# Patient Record
Sex: Female | Born: 1980 | Race: White | Hispanic: No | Marital: Single | Smoking: Never smoker
Health system: Southern US, Community
[De-identification: ages and names within clinical notes are randomized; demographics above are authoritative.]

## PROBLEM LIST (undated history)

## (undated) DIAGNOSIS — E282 Polycystic ovarian syndrome: Secondary | ICD-10-CM

## (undated) DIAGNOSIS — R51 Headache: Secondary | ICD-10-CM

## (undated) DIAGNOSIS — M67439 Ganglion, unspecified wrist: Secondary | ICD-10-CM

## (undated) DIAGNOSIS — R471 Dysarthria and anarthria: Secondary | ICD-10-CM

## (undated) DIAGNOSIS — E8881 Metabolic syndrome: Secondary | ICD-10-CM

## (undated) DIAGNOSIS — D6861 Antiphospholipid syndrome: Secondary | ICD-10-CM

## (undated) DIAGNOSIS — D689 Coagulation defect, unspecified: Secondary | ICD-10-CM

## (undated) DIAGNOSIS — N809 Endometriosis, unspecified: Secondary | ICD-10-CM

## (undated) DIAGNOSIS — R000 Tachycardia, unspecified: Secondary | ICD-10-CM

## (undated) DIAGNOSIS — M654 Radial styloid tenosynovitis [de Quervain]: Secondary | ICD-10-CM

## (undated) DIAGNOSIS — J45909 Unspecified asthma, uncomplicated: Secondary | ICD-10-CM

## (undated) DIAGNOSIS — B009 Herpesviral infection, unspecified: Secondary | ICD-10-CM

## (undated) DIAGNOSIS — E88819 Insulin resistance, unspecified: Secondary | ICD-10-CM

## (undated) DIAGNOSIS — E119 Type 2 diabetes mellitus without complications: Secondary | ICD-10-CM

## (undated) DIAGNOSIS — G4733 Obstructive sleep apnea (adult) (pediatric): Secondary | ICD-10-CM

## (undated) DIAGNOSIS — R011 Cardiac murmur, unspecified: Secondary | ICD-10-CM

## (undated) DIAGNOSIS — J302 Other seasonal allergic rhinitis: Secondary | ICD-10-CM

## (undated) DIAGNOSIS — G5601 Carpal tunnel syndrome, right upper limb: Secondary | ICD-10-CM

## (undated) DIAGNOSIS — R519 Headache, unspecified: Secondary | ICD-10-CM

## (undated) DIAGNOSIS — R102 Pelvic and perineal pain: Secondary | ICD-10-CM

## (undated) DIAGNOSIS — G43909 Migraine, unspecified, not intractable, without status migrainosus: Secondary | ICD-10-CM

## (undated) DIAGNOSIS — E559 Vitamin D deficiency, unspecified: Secondary | ICD-10-CM

## (undated) DIAGNOSIS — G8929 Other chronic pain: Secondary | ICD-10-CM

## (undated) DIAGNOSIS — J4530 Mild persistent asthma, uncomplicated: Secondary | ICD-10-CM

## (undated) HISTORY — DX: Pelvic and perineal pain: R10.2

## (undated) HISTORY — DX: Ganglion, unspecified wrist: M67.439

## (undated) HISTORY — DX: Radial styloid tenosynovitis (de quervain): M65.4

## (undated) HISTORY — DX: Other chronic pain: G89.29

## (undated) HISTORY — DX: Vitamin D deficiency, unspecified: E55.9

## (undated) HISTORY — PX: ABDOMINAL HYSTERECTOMY: SHX81

## (undated) HISTORY — DX: Mild persistent asthma, uncomplicated: J45.30

## (undated) HISTORY — PX: DRUG INDUCED ENDOSCOPY: SHX6808

## (undated) HISTORY — DX: Carpal tunnel syndrome, right upper limb: G56.01

## (undated) HISTORY — DX: Herpesviral infection, unspecified: B00.9

## (undated) HISTORY — DX: Dysarthria and anarthria: R47.1

## (undated) HISTORY — PX: OTHER SURGICAL HISTORY: SHX169

## (undated) HISTORY — DX: Polycystic ovarian syndrome: E28.2

## (undated) HISTORY — PX: TONSILLECTOMY: SUR1361

## (undated) HISTORY — DX: Migraine, unspecified, not intractable, without status migrainosus: G43.909

## (undated) HISTORY — DX: Other seasonal allergic rhinitis: J30.2

## (undated) HISTORY — PX: GALLBLADDER SURGERY: SHX652

## (undated) HISTORY — DX: Obstructive sleep apnea (adult) (pediatric): G47.33

---

## 2010-10-20 DIAGNOSIS — J4530 Mild persistent asthma, uncomplicated: Secondary | ICD-10-CM

## 2010-10-20 DIAGNOSIS — E282 Polycystic ovarian syndrome: Secondary | ICD-10-CM

## 2010-10-20 HISTORY — DX: Polycystic ovarian syndrome: E28.2

## 2010-10-20 HISTORY — DX: Mild persistent asthma, uncomplicated: J45.30

## 2010-11-04 DIAGNOSIS — E559 Vitamin D deficiency, unspecified: Secondary | ICD-10-CM

## 2010-11-04 DIAGNOSIS — J302 Other seasonal allergic rhinitis: Secondary | ICD-10-CM

## 2010-11-04 DIAGNOSIS — R7303 Prediabetes: Secondary | ICD-10-CM | POA: Insufficient documentation

## 2010-11-04 DIAGNOSIS — R030 Elevated blood-pressure reading, without diagnosis of hypertension: Secondary | ICD-10-CM | POA: Insufficient documentation

## 2010-11-04 DIAGNOSIS — E8881 Metabolic syndrome: Secondary | ICD-10-CM | POA: Insufficient documentation

## 2010-11-04 HISTORY — DX: Vitamin D deficiency, unspecified: E55.9

## 2010-11-04 HISTORY — DX: Other seasonal allergic rhinitis: J30.2

## 2010-11-05 DIAGNOSIS — D6861 Antiphospholipid syndrome: Secondary | ICD-10-CM | POA: Insufficient documentation

## 2011-09-23 DIAGNOSIS — G4733 Obstructive sleep apnea (adult) (pediatric): Secondary | ICD-10-CM

## 2011-09-23 HISTORY — DX: Obstructive sleep apnea (adult) (pediatric): G47.33

## 2011-10-07 DIAGNOSIS — G43909 Migraine, unspecified, not intractable, without status migrainosus: Secondary | ICD-10-CM

## 2011-10-07 DIAGNOSIS — Z8679 Personal history of other diseases of the circulatory system: Secondary | ICD-10-CM | POA: Insufficient documentation

## 2011-10-07 DIAGNOSIS — E669 Obesity, unspecified: Secondary | ICD-10-CM | POA: Insufficient documentation

## 2011-10-07 HISTORY — DX: Migraine, unspecified, not intractable, without status migrainosus: G43.909

## 2012-05-23 DIAGNOSIS — G8929 Other chronic pain: Secondary | ICD-10-CM | POA: Insufficient documentation

## 2012-05-23 DIAGNOSIS — R102 Pelvic and perineal pain unspecified side: Secondary | ICD-10-CM

## 2012-05-23 HISTORY — DX: Pelvic and perineal pain unspecified side: R10.20

## 2012-05-23 HISTORY — DX: Other chronic pain: G89.29

## 2013-04-26 DIAGNOSIS — M67439 Ganglion, unspecified wrist: Secondary | ICD-10-CM

## 2013-04-26 HISTORY — DX: Ganglion, unspecified wrist: M67.439

## 2013-05-10 DIAGNOSIS — M654 Radial styloid tenosynovitis [de Quervain]: Secondary | ICD-10-CM

## 2013-05-10 HISTORY — DX: Radial styloid tenosynovitis (de quervain): M65.4

## 2013-08-08 DIAGNOSIS — G5601 Carpal tunnel syndrome, right upper limb: Secondary | ICD-10-CM

## 2013-08-08 HISTORY — DX: Carpal tunnel syndrome, right upper limb: G56.01

## 2013-11-07 DIAGNOSIS — R1032 Left lower quadrant pain: Secondary | ICD-10-CM | POA: Insufficient documentation

## 2014-04-05 DIAGNOSIS — B009 Herpesviral infection, unspecified: Secondary | ICD-10-CM

## 2014-04-05 HISTORY — DX: Herpesviral infection, unspecified: B00.9

## 2014-05-19 ENCOUNTER — Ambulatory Visit: Payer: Self-pay | Admitting: Emergency Medicine

## 2014-09-28 ENCOUNTER — Ambulatory Visit
Admission: EM | Admit: 2014-09-28 | Discharge: 2014-09-28 | Disposition: A | Discharge status: Home / Self Care | Payer: Medicaid Other | Attending: Family Medicine | Admitting: Family Medicine

## 2014-09-28 ENCOUNTER — Encounter: Payer: Self-pay | Admitting: Emergency Medicine

## 2014-09-28 ENCOUNTER — Ambulatory Visit: Payer: Medicaid Other

## 2014-09-28 DIAGNOSIS — M79675 Pain in left toe(s): Secondary | ICD-10-CM | POA: Diagnosis present

## 2014-09-28 DIAGNOSIS — M779 Enthesopathy, unspecified: Secondary | ICD-10-CM

## 2014-09-28 HISTORY — DX: Cardiac murmur, unspecified: R01.1

## 2014-09-28 HISTORY — DX: Polycystic ovarian syndrome: E28.2

## 2014-09-28 HISTORY — DX: Headache, unspecified: R51.9

## 2014-09-28 HISTORY — DX: Coagulation defect, unspecified: D68.9

## 2014-09-28 HISTORY — DX: Metabolic syndrome: E88.81

## 2014-09-28 HISTORY — DX: Insulin resistance, unspecified: E88.819

## 2014-09-28 HISTORY — DX: Tachycardia, unspecified: R00.0

## 2014-09-28 HISTORY — DX: Unspecified asthma, uncomplicated: J45.909

## 2014-09-28 HISTORY — DX: Endometriosis, unspecified: N80.9

## 2014-09-28 HISTORY — DX: Headache: R51

## 2014-09-28 MED ORDER — NAPROXEN 500 MG PO TABS: 500.0000 mg | ORAL_TABLET | Freq: Two times a day (BID) | ORAL | Status: DC

## 2014-09-28 NOTE — ED Notes (Signed)
Left great toe pain since yesterday. Hurts to put weight on it. Has tried Ibuprofen and used Ice.

## 2014-09-28 NOTE — ED Provider Notes (Signed)
Patient presents today with symptoms of left big toe pain since yesterday. Patient denies any trauma or injury to the site. She denies any history of gout. She states that there is pain when she puts weight on it and bends her toe. She has tried ibuprofen and ice. She denies any other joint pain. She admits to some intake of red meat yesterday but this was after the pain started. She denies any recent alcohol use or seafood intake.  Review systems negative except mentioned above. Vitals as per Epic  General-NAD Respiratory-CTA bilateral Cardiac-regular rate rhythm MSK-no obvious deformity of left toe, mild tenderness of MTP joint, reproduced with resisted extension and flexion, neurovascularly intact, no erythema warmth of the area  Assessment and Plan: L 1st Toe Pain- discussed possible tendinitis, x-rays appear to be normal, would treat with naproxen when necessary, ice when necessary, hard soled shoe that limits movement of the toe, seek medical attention if symptoms persist or worsen.    Jolene ProvostKirtida Karanveer Ramakrishnan, MD 09/28/14 1149

## 2014-10-10 DIAGNOSIS — R471 Dysarthria and anarthria: Secondary | ICD-10-CM

## 2014-10-10 HISTORY — DX: Dysarthria and anarthria: R47.1

## 2016-11-15 ENCOUNTER — Emergency Department
Admission: EM | Admit: 2016-11-15 | Discharge: 2016-11-16 | Disposition: A | Discharge status: Home / Self Care | Payer: Self-pay | Attending: Emergency Medicine | Admitting: Emergency Medicine

## 2016-11-15 ENCOUNTER — Encounter: Payer: Self-pay | Admitting: Emergency Medicine

## 2016-11-15 DIAGNOSIS — J45909 Unspecified asthma, uncomplicated: Secondary | ICD-10-CM | POA: Insufficient documentation

## 2016-11-15 DIAGNOSIS — R1031 Right lower quadrant pain: Secondary | ICD-10-CM | POA: Insufficient documentation

## 2016-11-15 DIAGNOSIS — Z79899 Other long term (current) drug therapy: Secondary | ICD-10-CM | POA: Insufficient documentation

## 2016-11-15 DIAGNOSIS — Z7982 Long term (current) use of aspirin: Secondary | ICD-10-CM | POA: Insufficient documentation

## 2016-11-15 LAB — COMPREHENSIVE METABOLIC PANEL
ALT: 14 U/L (ref 14–54)
ANION GAP: 6 (ref 5–15)
AST: 16 U/L (ref 15–41)
Albumin: 4 g/dL (ref 3.5–5.0)
Alkaline Phosphatase: 80 U/L (ref 38–126)
BUN: 11 mg/dL (ref 6–20)
CALCIUM: 9 mg/dL (ref 8.9–10.3)
CHLORIDE: 107 mmol/L (ref 101–111)
CO2: 26 mmol/L (ref 22–32)
Creatinine, Ser: 0.73 mg/dL (ref 0.44–1.00)
Glucose, Bld: 130 mg/dL — ABNORMAL HIGH (ref 65–99)
POTASSIUM: 3.9 mmol/L (ref 3.5–5.1)
Sodium: 139 mmol/L (ref 135–145)
TOTAL PROTEIN: 7.7 g/dL (ref 6.5–8.1)
Total Bilirubin: 0.2 mg/dL — ABNORMAL LOW (ref 0.3–1.2)

## 2016-11-15 LAB — CBC
HEMATOCRIT: 35.8 % (ref 35.0–47.0)
Hemoglobin: 12.2 g/dL (ref 12.0–16.0)
MCH: 28.5 pg (ref 26.0–34.0)
MCHC: 34.1 g/dL (ref 32.0–36.0)
MCV: 83.7 fL (ref 80.0–100.0)
Platelets: 257 10*3/uL (ref 150–440)
RBC: 4.27 MIL/uL (ref 3.80–5.20)
RDW: 13.1 % (ref 11.5–14.5)
WBC: 9.1 10*3/uL (ref 3.6–11.0)

## 2016-11-15 LAB — URINALYSIS, COMPLETE (UACMP) WITH MICROSCOPIC
BILIRUBIN URINE: NEGATIVE
GLUCOSE, UA: NEGATIVE mg/dL
Ketones, ur: NEGATIVE mg/dL
Leukocytes, UA: NEGATIVE
Nitrite: POSITIVE — AB
Protein, ur: NEGATIVE mg/dL
SPECIFIC GRAVITY, URINE: 1.023 (ref 1.005–1.030)
pH: 6 (ref 5.0–8.0)

## 2016-11-15 LAB — LIPASE, BLOOD: LIPASE: 31 U/L (ref 11–51)

## 2016-11-15 NOTE — ED Triage Notes (Signed)
Patient ambulatory to triage with steady gait, without difficulty or distress noted; pt reports right lower abd pain since 1pm, nonradiating accomp by nausea

## 2016-11-15 NOTE — ED Notes (Signed)
POCT urine entered in error; not performed; pt with hx hysterectomy

## 2016-11-16 ENCOUNTER — Encounter: Payer: Self-pay | Admitting: Radiology

## 2016-11-16 ENCOUNTER — Emergency Department: Payer: Self-pay

## 2016-11-16 MED ORDER — HYDROMORPHONE HCL 1 MG/ML IJ SOLN
1.0000 mg | Freq: Once | INTRAMUSCULAR | Status: AC
  Administered 2016-11-16: 1 mg via INTRAVENOUS
  Filled 2016-11-16: qty 1

## 2016-11-16 MED ORDER — IOPAMIDOL (ISOVUE-300) INJECTION 61%
100.0000 mL | Freq: Once | INTRAVENOUS | Status: AC | PRN
  Administered 2016-11-16: 100 mL via INTRAVENOUS

## 2016-11-16 MED ORDER — ONDANSETRON 4 MG PO TBDP: 4.0000 mg | ORAL_TABLET | Freq: Three times a day (TID) | ORAL | 0 refills | Status: DC | PRN

## 2016-11-16 MED ORDER — SODIUM CHLORIDE 0.9 % IV BOLUS (SEPSIS)
1000.0000 mL | Freq: Once | INTRAVENOUS | Status: AC
  Administered 2016-11-16: 1000 mL via INTRAVENOUS

## 2016-11-16 MED ORDER — ONDANSETRON HCL 4 MG/2ML IJ SOLN
4.0000 mg | Freq: Once | INTRAMUSCULAR | Status: AC
  Administered 2016-11-16: 4 mg via INTRAVENOUS
  Filled 2016-11-16: qty 2

## 2016-11-16 NOTE — ED Notes (Signed)
MD Zenda Alpers in with pt at this time.

## 2016-11-16 NOTE — ED Notes (Signed)
Patient transported to CT 

## 2016-11-16 NOTE — ED Notes (Signed)
Pt states abdominal pain and nausea starting approx 1130 am 8/20, tylenol po without relief. Pt states nothing makes pain or nausea better/worse. Pt reports recent urinary frequency. Denies vomiting, diarrhea, vaginal changes.

## 2016-11-16 NOTE — Discharge Instructions (Signed)
Please follow-up in the emergency department or with surgery in the next 24-48 hours if her pain persists. We did perform a CT scan which was unremarkable but it is possible that the pain is due to early appendicitis that was not yet present on CT scan. The blood work is unremarkable and the pain is improved at this time. Please follow-up for further evaluation.

## 2016-11-16 NOTE — ED Provider Notes (Signed)
[facility] Emergency Department Provider Note   ____________________________________________   First MD Initiated Contact with Patient 11/15/16 2357     (approximate)  I have reviewed the triage vital signs and the nursing notes.   HISTORY  Chief Complaint Abdominal Pain    HPI Amber Ochoa is a 36 y.o. female who comes into the hospital today with some right-sided abdominal pain. The patient states this got worse throughout the day. She reports it hurts to walk breathe ankle over bumps on the drive home. The patient states that this all started today around 11:00. She took some Tylenol but it didn't help. The patient has had some nausea with no vomiting and denies diarrhea or constipation. She rates her pain 8 out of 10 in intensity currently. The patient has had no fever at home. She is around 3 PM. She has a decreased appetite and she's never had this before. The patient states that she's had her gallbladder out but the pain was much higher. She is here for evaluation.    Past Medical History:  Diagnosis Date  . Asthma   . Blood clotting disorder (HCC)   . Endometriosis   . Headache   . Heart murmur   . 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 PCOS (polycystic ovarian syndrome)   . Tachycardia     There are no active problems to display for this patient.   Past Surgical History:  Procedure Laterality Date  . ABDOMINAL HYSTERECTOMY    . GALLBLADDER SURGERY    . Tendonitis Right     Prior to Admission medications   Medication Sig Start Date End Date Taking? Authorizing Provider  gabapentin (NEURONTIN) 400 MG capsule Take 400 mg by mouth at bedtime.   Yes [provider]  albuterol (PROVENTIL HFA;VENTOLIN HFA) 108 (90 BASE) MCG/ACT inhaler Inhale into the lungs every 6 (six) hours as needed for wheezing or shortness of breath.    [provider]  aspirin EC 81 MG tablet Take 81 mg by mouth daily.    [provider]  naproxen (NAPROSYN) 500 MG tablet Take 1 tablet (500 mg total) by mouth 2 (two) times daily. 09/28/14   Jolene Provost, MD  ondansetron (ZOFRAN ODT) 4 MG disintegrating tablet Take 1 tablet (4 mg total) by mouth every 8 (eight) hours as needed for nausea or vomiting. 11/16/16   Rebecka Apley, MD    Allergies Morphine and related; Sudafed [pseudoephedrine hcl]; and Sulfa antibiotics  No family history on file.  Social History Social History  Substance Use Topics  . Smoking status: Never Smoker  . Smokeless tobacco: Never Used  . Alcohol use No    Review of Systems  Constitutional: No fever/chills Eyes: No visual changes. ENT: No sore throat. Cardiovascular: Denies chest pain. Respiratory: Denies shortness of breath. Gastrointestinal:  abdominal pain, nausea, no vomiting.  No diarrhea.  No constipation. Genitourinary: Negative for dysuria. Musculoskeletal: Negative for back pain. Skin: Negative for rash. Neurological: Negative for headaches, focal weakness or numbness.   ____________________________________________   PHYSICAL EXAM:  VITAL SIGNS: ED Triage Vitals [11/15/16 2032]  Enc Vitals Group     BP (!) 153/71     Pulse Rate (!) 102     Resp 18     Temp 99.6 F (37.6 C)     Temp Source Oral     SpO2 99 %     Weight 230 lb (104.3 kg)  Height 5\' 3"  (1.6 m)     Head Circumference      Peak Flow      Pain Score 8     Pain Loc      Pain Edu?      Excl. in GC?     Constitutional: Alert and oriented. Well appearing and in moderate distress. Eyes: Conjunctivae are normal. PERRL. EOMI. Head: Atraumatic. Nose: No congestion/rhinnorhea. Mouth/Throat: Mucous membranes are moist.  Oropharynx non-erythematous. Cardiovascular: Normal rate, regular rhythm. Grossly normal heart sounds.  Good peripheral circulation. Respiratory: Normal respiratory effort.  No retractions. Lungs CTAB. Gastrointestinal: Soft with some RLQ tenderness to palpation,  rebound and guarding. No distention. Positive bowel sounds Musculoskeletal: No lower extremity tenderness nor edema.   Neurologic:  Normal speech and language.  Skin:  Skin is warm, dry and intact.  Psychiatric: Mood and affect are normal.   ____________________________________________   LABS (all labs ordered are listed, but only abnormal results are displayed)  Labs Reviewed  COMPREHENSIVE METABOLIC PANEL - Abnormal; Notable for the following:       Result Value   Glucose, Bld 130 (*)    Total Bilirubin 0.2 (*)    All other components within normal limits  URINALYSIS, COMPLETE (UACMP) WITH MICROSCOPIC - Abnormal; Notable for the following:    Color, Urine YELLOW (*)    APPearance HAZY (*)    Hgb urine dipstick SMALL (*)    Nitrite POSITIVE (*)    Bacteria, UA RARE (*)    Squamous Epithelial / LPF 6-30 (*)    All other components within normal limits  URINE CULTURE  LIPASE, BLOOD  CBC  POC URINE PREG, ED   ____________________________________________  EKG  none ____________________________________________  RADIOLOGY  Ct Abdomen Pelvis W Contrast  Result Date: 11/16/2016 CLINICAL DATA:  Nonradiating right lower abdominal pain since 1 p.m. Hysterectomy. Only the left ovary remains. EXAM: CT ABDOMEN AND PELVIS WITH CONTRAST TECHNIQUE: Multidetector CT imaging of the abdomen and pelvis was performed using the standard protocol following bolus administration of intravenous contrast. CONTRAST:  ISOVUE-300 IOPAMIDOL (ISOVUE-300) INJECTION 61% COMPARISON:  None. FINDINGS: Lower chest: The included heart is normal in size without pericardial effusion. The lung bases are clear. Hepatobiliary: Cholecystectomy. The liver enhances homogeneously. No hepatic mass or biliary dilatation is noted. Pancreas: Unremarkable. No pancreatic ductal dilatation or surrounding inflammatory changes. Spleen: Normal in size without focal abnormality. Adrenals/Urinary Tract: Adrenal glands are  unremarkable. Kidneys are normal, without renal calculi, focal lesion, or hydronephrosis. Bladder is unremarkable. Stomach/Bowel: Stomach is within normal limits. Appendix appears normal. No evidence of bowel wall thickening, distention, or inflammatory changes. Vascular/Lymphatic: No significant vascular findings are present. No enlarged abdominal or pelvic lymph nodes. Reproductive: Status post hysterectomy and right oophorectomy by report. Hemorrhagic or corpus luteal cyst measuring 1.6 x 1.2 x 1.6 cm is noted of the remaining left ovary. Other: No abdominal wall hernia or abnormality. No abdominopelvic ascites. Musculoskeletal: No acute or significant osseous findings. IMPRESSION: 1. Hemorrhagic or corpus luteal cyst of the left ovary. 2. Status post hysterectomy and right oophorectomy. 3. Status post cholecystectomy. 4. Otherwise negative exam. Electronically Signed   By: Tollie Eth M.D.   On: 11/16/2016 01:18    ____________________________________________   PROCEDURES  Procedure(s) performed: None  Procedures  Critical Care performed: No  ____________________________________________   INITIAL IMPRESSION / ASSESSMENT AND PLAN / ED COURSE  Pertinent labs & imaging results that were available during my care of the patient were reviewed by me  and considered in my medical decision making (see chart for details).  This is a 36 year old female who comes into the hospital today with some right lower quadrant abdominal pain. The patient has had her gallbladder out and she's had a hysterectomy with a right-sided salpingo-oophorectomy. Given the patient's history I'm concerned about possible appendicitis. The patient's blood work is unremarkable. While the patient does have positive nitrates in her urine she has no white blood cells and no red blood cells. I will send the patient for a CT scan for evaluation. She'll receive a dose of Dilaudid and Zofran as well as a liter of normal saline.      The CT scan results returned and were negative. The patient's pain is improved. I discussed with the patient the possibility of appendicitis and that it may be too early to discover on the CT scan. I encouraged the patient to return in 24-48 hours for an abdominal recheck or to follow-up with surgery in 24-48 hours for an abdominal recheck. The patient understands and agrees with the plan. She'll be discharged home to follow-up.  ____________________________________________   FINAL CLINICAL IMPRESSION(S) / ED DIAGNOSES  Final diagnoses:  Right lower quadrant abdominal pain      NEW MEDICATIONS STARTED DURING THIS VISIT:  New Prescriptions   ONDANSETRON (ZOFRAN ODT) 4 MG DISINTEGRATING TABLET    Take 1 tablet (4 mg total) by mouth every 8 (eight) hours as needed for nausea or vomiting.     Note:  This document was prepared using Dragon voice recognition software and may include unintentional dictation errors.    Rebecka Apley, MD 11/16/16 [phone]

## 2016-11-17 LAB — URINE CULTURE

## 2016-11-19 ENCOUNTER — Encounter: Payer: Self-pay | Admitting: Surgery

## 2016-11-19 ENCOUNTER — Ambulatory Visit (INDEPENDENT_AMBULATORY_CARE_PROVIDER_SITE_OTHER): Payer: Self-pay | Admitting: Surgery

## 2016-11-19 VITALS — BP 139/92 | HR 96 | Temp 98.7°F | Ht 63.0 in | Wt 224.6 lb

## 2016-11-19 DIAGNOSIS — R1031 Right lower quadrant pain: Secondary | ICD-10-CM

## 2016-11-19 NOTE — Patient Instructions (Signed)
We suggest that you stay hydrated, avoid constipation, and eat a high fiber diet. Below I have have listed some suggestions for a high fiber diet.   Please follow up with your Primary Care Provider however if there is something that we can do for you please give our office a call.   High-Fiber Diet Fiber, also called dietary fiber, is a type of carbohydrate found in fruits, vegetables, whole grains, and beans. A high-fiber diet can have many health benefits. Your health care provider may recommend a high-fiber diet to help:  Prevent constipation. Fiber can make your bowel movements more regular.  Lower your cholesterol.  Relieve hemorrhoids, uncomplicated diverticulosis, or irritable bowel syndrome.  Prevent overeating as part of a weight-loss plan.  Prevent heart disease, type 2 diabetes, and certain cancers.  What is my plan? The recommended daily intake of fiber includes:  38 grams for men under age 17.  30 grams for men over age 48.  25 grams for women under age 80.  21 grams for women over age 69.  You can get the recommended daily intake of dietary fiber by eating a variety of fruits, vegetables, grains, and beans. Your health care provider may also recommend a fiber supplement if it is not possible to get enough fiber through your diet. What do I need to know about a high-fiber diet?  Fiber supplements have not been widely studied for their effectiveness, so it is better to get fiber through food sources.  Always check the fiber content on thenutrition facts label of any prepackaged food. Look for foods that contain at least 5 grams of fiber per serving.  Ask your dietitian if you have questions about specific foods that are related to your condition, especially if those foods are not listed in the following section.  Increase your daily fiber consumption gradually. Increasing your intake of dietary fiber too quickly may cause bloating, cramping, or gas.  Drink plenty  of water. Water helps you to digest fiber. What foods can I eat? Grains Whole-grain breads. Multigrain cereal. Oats and oatmeal. Brown rice. Barley. Bulgur wheat. Millet. Bran muffins. Popcorn. Rye wafer crackers. Vegetables Sweet potatoes. Spinach. Kale. Artichokes. Cabbage. Broccoli. Green peas. Carrots. Squash. Fruits Berries. Pears. Apples. Oranges. Avocados. Prunes and raisins. Dried figs. Meats and Other Protein Sources Navy, kidney, pinto, and soy beans. Split peas. Lentils. Nuts and seeds. Dairy Fiber-fortified yogurt. Beverages Fiber-fortified soy milk. Fiber-fortified orange juice. Other Fiber bars. The items listed above may not be a complete list of recommended foods or beverages. Contact your dietitian for more options. What foods are not recommended? Grains White bread. Pasta made with refined flour. White rice. Vegetables Fried potatoes. Canned vegetables. Well-cooked vegetables. Fruits Fruit juice. Cooked, strained fruit. Meats and Other Protein Sources Fatty cuts of meat. Fried Environmental education officer or fried fish. Dairy Milk. Yogurt. Cream cheese. Sour cream. Beverages Soft drinks. Other Cakes and pastries. Butter and oils. The items listed above may not be a complete list of foods and beverages to avoid. Contact your dietitian for more information. What are some tips for including high-fiber foods in my diet?  Eat a wide variety of high-fiber foods.  Make sure that half of all grains consumed each day are whole grains.  Replace breads and cereals made from refined flour or white flour with whole-grain breads and cereals.  Replace white rice with brown rice, bulgur wheat, or millet.  Start the day with a breakfast that is high in fiber, such as a cereal  that contains at least 5 grams of fiber per serving.  Use beans in place of meat in soups, salads, or pasta.  Eat high-fiber snacks, such as berries, raw vegetables, nuts, or popcorn. This information is not intended  to replace advice given to you by your health care provider. Make sure you discuss any questions you have with your health care provider. Document Released: 03/15/2005 Document Revised: 08/21/2015 Document Reviewed: 08/28/2013 Elsevier Interactive Patient Education  2017 [employer].

## 2016-11-20 NOTE — Progress Notes (Signed)
Surgical Clinic History and Physical  Referring provider:  No referring provider defined for this encounter.  HISTORY OF PRESENT ILLNESS (HPI):  36 y.o. female presents for evaluation of RLQ abdominal pain. Patient reports the pain began at her umbilicus 6 days ago on Saturday, 8/18 and then progressed to her RLQ. She then presented to [facility] ED, where CT was performed without radiographic evidence of appendicitis, and patient was advised to follow-up with surgeon for possible early acute appendicitis not visualized on CT imaging. Patient describes that her pain the resolved until returning again last night, since which it has become more mild today. Patient otherwise reports routine +flatus with daily +BM's and denies N/V or fever/chills, CP, or SOB.  PAST MEDICAL HISTORY (PMH):  Past Medical History:  Diagnosis Date  . Asthma   . Blood clotting disorder (HCC)   . Carpal tunnel syndrome of right wrist 08/08/2013  . Chronic asthma, mild persistent, uncomplicated 10/20/2010  . Chronic pelvic pain in female 05/23/2012  . De Quervain's disease (radial styloid tenosynovitis) 05/10/2013  . Dysarthria 10/10/2014  . Endometriosis   . Ganglion cyst of wrist 04/26/2013  . Headache   . Heart murmur   . 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 Migraine 10/07/2011  . Moderate obstructive sleep apnea 09/23/2011   Overview:  June 15, 2014: SPLIT-NIGHT PSG @ [facility]: overall AHI 26.2/hr. The patient had CPAP applied. The maximal observed pressure was: 11 cm H2O. The recommended pressure is: 11-16 cm H2O.  CPAP set up on 08/28/14 by [employer]  . PCOS (polycystic ovarian syndrome)   . Polycystic ovarian syndrome 10/20/2010  . Seasonal allergies 11/04/2010  . Tachycardia   . Vitamin D deficiency disease 11/04/2010     PAST SURGICAL HISTORY [facility]):  Past Surgical History:  Procedure Laterality Date  . ABDOMINAL HYSTERECTOMY    .  GALLBLADDER SURGERY    . Tendonitis Right      MEDICATIONS:  Prior to Admission medications   Medication Sig Start Date End Date Taking? Authorizing Provider  albuterol (PROVENTIL HFA;VENTOLIN HFA) 108 (90 BASE) MCG/ACT inhaler Inhale into the lungs every 6 (six) hours as needed for wheezing or shortness of breath.   Yes [provider]  aspirin EC 81 MG tablet Take 81 mg by mouth daily.   Yes [provider]  gabapentin (NEURONTIN) 400 MG capsule Take 400 mg by mouth at bedtime.   Yes [provider]  omeprazole (PRILOSEC) 20 MG capsule Take by mouth. 12/09/15 12/08/16 Yes [provider]  ondansetron (ZOFRAN ODT) 4 MG disintegrating tablet Take 1 tablet (4 mg total) by mouth every 8 (eight) hours as needed for nausea or vomiting. 11/16/16  Yes Rebecka Apley, MD  valACYclovir (VALTREX) 1000 MG tablet Take by mouth. 10/18/14  Yes [provider]     ALLERGIES:  Allergies  Allergen Reactions  . Morphine Shortness Of Breath  . Morphine And Related Shortness Of Breath  . Sulfa Antibiotics Rash  . Other Other (See Comments)    Vicryl and polysorb suture produce local inflammation  . Sudafed [Pseudoephedrine Hcl] Palpitations and Other (See Comments)  . Sulfamethoxazole-Trimethoprim Itching and Rash     SOCIAL HISTORY:  Social History   Social History  . Marital status: Single    Spouse name: N/A  . Number of children: N/A  . Years of education: N/A   Occupational History  . Not on  file.   Social History Main Topics  . Smoking status: Never Smoker  . Smokeless tobacco: Never Used  . Alcohol use No  . Drug use: No  . Sexual activity: Not on file   Other Topics Concern  . Not on file   Social History Narrative  . No narrative on file    The patient currently resides (home / rehab facility / nursing home): Home  The patient normally is (ambulatory / bedbound): Ambulatory   FAMILY HISTORY:  History reviewed. No pertinent  family history.  Otherwise negative/non-contributory.  REVIEW OF SYSTEMS:  Constitutional: denies any other weight loss, fever, chills, or sweats  Eyes: denies any other vision changes, history of eye injury  ENT: denies sore throat, hearing problems  Respiratory: denies shortness of breath, wheezing  Cardiovascular: denies chest pain, palpitations  Gastrointestinal: abdominal pain, N/V, and bowel function as per HPI Musculoskeletal: denies any other joint pains or cramps  Skin: Denies any other rashes or skin discolorations  Neurological: denies any other headache, dizziness, weakness  Psychiatric: Denies any other depression, anxiety   All other review of systems were otherwise negative   VITAL SIGNS:  @VSRANGES @     Height: 5\' 3"  (160 cm) Weight: 224 lb 9.6 oz (101.9 kg) BMI (Calculated): 39.8   PHYSICAL EXAM:  Constitutional:  -- Obese body habitus  -- Awake, alert, and oriented x3  Eyes:  -- Pupils equally round and reactive to light  -- No scleral icterus  Ear, nose, throat:  -- No jugular venous distension -- No nasal drainage, bleeding Pulmonary:  -- No crackles  -- Equal breath sounds bilaterally -- Breathing non-labored at rest Cardiovascular:  -- S1, S2 present  -- No pericardial rubs  Gastrointestinal:  -- Abdomen soft and non-distended with minimal-/mild- RLQ abdominal tenderness to palpation, no guarding/rebound  -- No abdominal masses appreciated, pulsatile or otherwise  Musculoskeletal and Integumentary:  -- Wounds or skin discoloration: None appreciated -- Extremities: B/L UE and LE FROM, hands and feet warm, no edema  Neurologic:  -- Motor function: Intact and symmetric -- Sensation: Intact and symmetric  Labs:  CBC:  Lab Results  Component Value Date   WBC 9.1 11/15/2016   RBC 4.27 11/15/2016   BMP:  Lab Results  Component Value Date   GLUCOSE 130 (H) 11/15/2016   CO2 26 11/15/2016   BUN 11 11/15/2016   CREATININE 0.73 11/15/2016    CALCIUM 9.0 11/15/2016     Imaging studies:  CT Abdomen and Pelvis with Contrast (11/16/2016) - personally reviewed and discussed with patient 1. Hemorrhagic or corpus luteal cyst of the left ovary. 2. Status post hysterectomy and right oophorectomy. 3. Status post cholecystectomy. 4. Otherwise negative exam.  Assessment/Plan:  36 y.o. female with variable RLQ abdominal pain s/p remote Right oophorectomy and hysterectomy with clinical history inconsistent with and no radiographic evidence of appendicitis or SBO, complicated by co-morbidities including obesity (BMI 40), insulin resistance, heart murmer not otherwise specified, endometriosis, asthma, OSA, polycystic ovarian syndrome, migraine headaches, chronic pelvic pain in female, and Right wrist carpel tunnel syndrome.   - no indication for surgical intervention at this time  - maintain hydration and high fiber diet to minimize constipation  - follow up with primary medical physician  - return to surgery clinic as needed  All of the above recommendations were discussed with the patient, and all of patient's questions were answered to her expressed satisfaction.  Thank you for the opportunity to participate in this patient's care.  --  Marilynne Drivers. Rosana Hoes, MD, [place]: [facility] - Partnering for exceptional care. Office: [phone]

## 2017-04-18 ENCOUNTER — Other Ambulatory Visit: Payer: Self-pay | Admitting: Orthopedic Surgery

## 2017-04-18 DIAGNOSIS — M25562 Pain in left knee: Secondary | ICD-10-CM

## 2017-04-22 ENCOUNTER — Ambulatory Visit
Admission: RE | Admit: 2017-04-22 | Discharge: 2017-04-22 | Disposition: A | Discharge status: Home / Self Care | Payer: Medicaid Other | Source: Ambulatory Visit | Attending: Orthopedic Surgery | Admitting: Orthopedic Surgery

## 2017-04-22 DIAGNOSIS — M25562 Pain in left knee: Secondary | ICD-10-CM | POA: Insufficient documentation

## 2017-04-22 DIAGNOSIS — R600 Localized edema: Secondary | ICD-10-CM | POA: Insufficient documentation

## 2017-05-12 ENCOUNTER — Ambulatory Visit: Payer: Medicaid Other | Attending: Orthopedic Surgery

## 2017-05-12 DIAGNOSIS — M25562 Pain in left knee: Secondary | ICD-10-CM | POA: Diagnosis not present

## 2017-05-12 DIAGNOSIS — M6281 Muscle weakness (generalized): Secondary | ICD-10-CM | POA: Insufficient documentation

## 2017-05-12 NOTE — Therapy (Signed)
[place] [facility] PHYSICAL AND SPORTS MEDICINE [address], [place], [address] Phone: [phone]   Fax:  [phone]  Physical Therapy Evaluation  Patient Details  Name: Amber Ochoa MRN: [id number] Date of Birth: 1981-03-20 Referring Provider: Juanell Fairly MD   Encounter Date: 05/12/2017  PT End of Session - 05/12/17 1902    Visit Number  1    Number of Visits  13    Date for PT Re-Evaluation  06/09/17    Authorization Type  MEDICAID    PT Start Time  1815    PT Stop Time  1915    PT Time Calculation (min)  60 min    Activity Tolerance  Patient tolerated treatment well    Behavior During Therapy  [facility] for tasks assessed/performed       Past Medical History:  Diagnosis Date  . Asthma   . Blood clotting disorder (HCC)   . Carpal tunnel syndrome of right wrist 08/08/2013  . Chronic asthma, mild persistent, uncomplicated 10/20/2010  . Chronic pelvic pain in female 05/23/2012  . De Quervain's disease (radial styloid tenosynovitis) 05/10/2013  . Dysarthria 10/10/2014  . Endometriosis   . Ganglion cyst of wrist 04/26/2013  . Headache   . Heart murmur   . 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 Migraine 10/07/2011  . Moderate obstructive sleep apnea 09/23/2011   Overview:  June 15, 2014: SPLIT-NIGHT PSG @ [facility]: overall AHI 26.2/hr. The patient had CPAP applied. The maximal observed pressure was: 11 cm H2O. The recommended pressure is: 11-16 cm H2O.  CPAP set up on 08/28/14 by [facility]  . PCOS (polycystic ovarian syndrome)   . Polycystic ovarian syndrome 10/20/2010  . Seasonal allergies 11/04/2010  . Tachycardia   . Vitamin D deficiency disease 11/04/2010    Past Surgical History:  Procedure Laterality Date  . ABDOMINAL HYSTERECTOMY    . GALLBLADDER SURGERY    . Tendonitis Right     There were no vitals filed for this visit.   Subjective Assessment -  05/12/17 1827    Subjective  Patient reports increased L knee pain after colliding with a someone at a workout class. Patient reports the pain has not been improving since the incident on Dec 9th 2018. Patient reports increased pain with high impact workouts, lunges, ascending up and down stairs and jumping. Patient reports she has to wear a brace when working out and walking/standing for long periods. Patient reports pain is worse with activity. Patient reports her pain lasts for ~ 1 hour after working out.      Pertinent History  History of LBP, Dequarivians, PCOS, clotting disorder, asthma     Limitations  House hold activities    Diagnostic tests  MRI: Increased swelling over the knee    Patient Stated Goals  Decrease pain, return to workout without modifications    Currently in Pain?  Yes    Pain Score  3  10/10 worse; best: 0/10    Pain Location  Knee    Pain Orientation  Left    Pain Descriptors / Indicators  Sharp;Throbbing;Aching;Dull    Pain Type  Acute pain    Pain Onset  More than a month ago    Pain Frequency  Constant         [facility] PT Assessment - 05/12/17 1824      Assessment   Medical Diagnosis  L Knee Pain    Referring Provider  Juanell Fairly MD    Onset Date/Surgical Date  03/06/17    Hand Dominance  Left    Next MD Visit  06/27/2017    Prior Therapy  yes - back      Balance Screen   Has the patient fallen in the past 6 months  Yes    How many times?  1    Has the patient had a decrease in activity level because of a fear of falling?   Yes    Is the patient reluctant to leave their home because of a fear of falling?   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    Leisure  working out, hanging out with children, traveling, 10k      Cognition   Overall Cognitive Status  Within Functional Limits for tasks assessed      Observation/Other Assessments    Observations  Increased guard with all knee motions    Other Surveys   Other Surveys    Lower Extremity Functional Scale   73/80       Sensation   Additional Comments  Decreased sensation along  L calf      Posture/Postural Control   Posture Comments  --      ROM / Strength   AROM / PROM / Strength  AROM;Strength      AROM   AROM Assessment Site  Knee;Ankle;Hip    Right/Left Hip  Right;Left    Right Hip Flexion  120    Left Hip Flexion  120    Right/Left Knee  Right;Left    Right Knee Extension  -- WNL    Right Knee Flexion  -- WNL    Left Knee Extension  10 10 from neutral    Left Knee Flexion  110    Right/Left Ankle  Left;Right    Right Ankle Dorsiflexion  0    Left Ankle Dorsiflexion  -20 PROM: 0      Strength   Strength Assessment Site  Hip;Knee;Ankle    Right/Left Hip  Right;Left    Right Hip Flexion  5/5    Right Hip Extension  4+/5    Right Hip External Rotation   5/5    Right Hip Internal Rotation  5/5    Right Hip ABduction  5/5    Right Hip ADduction  5/5    Left Hip Flexion  4/5    Left Hip Extension  4+/5    Left Hip External Rotation  4+/5    Left Hip Internal Rotation  4+/5    Left Hip ABduction  4+/5    Left Hip ADduction  4+/5    Right/Left Knee  Left;Right    Right Knee Flexion  5/5    Right Knee Extension  5/5    Left Knee Flexion  4/5    Left Knee Extension  4/5    Right/Left Ankle  Right;Left    Right Ankle Dorsiflexion  5/5    Left Ankle Dorsiflexion  4/5      Palpation   Palpation comment  Increased pain with palpation to all structures, do destinct increase in pain       Special Tests    Special Tests  Knee Special Tests    Knee Special tests   other;other2  other    Findings  Negative    Side   Left    Comments  Increased pain with all tests: Valgus, varus, ant drawer, post draw, lachmanns      other   findings  Positive    Side  Left    Comments  Joint line tenderness, Eges, Thesselys      Transfers   Comments   Increased pain with step down      Ambulation/Gait   Gait Comments  R trendelenber, circumduction on the L side,         Objective measurements completed on examination: See above findings.   Therapeutic Exercise: Ball roll outs in sitting -- x 2 min Mini squat -- x 1.965min    Patient reports no lasting increase in pain at end of the session        PT Education - 05/12/17 1841    Education provided  Yes    Education Details  HEP: mini squats, seated ball roll outs     Person(s) Educated  Patient    Methods  Explanation;Demonstration    Comprehension  Verbalized understanding;Returned demonstration          PT Long Term Goals - 05/12/17 1919      PT LONG TERM GOAL #1   Title  Patient will be independent with HEP to continue benefits of therapy after discharge    Baseline  Heavy cues for Ther ex performance    Time  6    Period  Weeks    Status  New    Target Date  06/23/17      PT LONG TERM GOAL #2   Title  Patient will have a worst pain of a 3/10 in the past week to indicate improvement at rest with knee pain.     Baseline  10/10 worst pain    Time  6    Period  Weeks    Status  New    Target Date  06/23/17      PT LONG TERM GOAL #3   Title  Patient will improve LEFS score to 80/80 to indicate improvement with running without increase in pain.     Baseline  74/80 LEFS    Time  6    Period  Weeks    Status  New    Target Date  06/23/17      PT LONG TERM GOAL #4   Title  Patient will be able to workout at her exercise class without increase in pain with activities.    Baseline  Increase in pain    Time  6    Period  Weeks    Status  New    Target Date  06/23/17             Plan - 05/12/17 1913    Clinical Impression Statement  Patient is a 37 yo left hand dominant female presenting with increased L knee pain after colliding with someone at a workout class. Patient demonstrates increased knee dysfunction with increased muscular guarding and  tissue aggravation throughout the knee. Patient demosntrates a high level LEFS for increased pain and demonstrates decreased strength and endurance within the musculature. Patient will benefit from further skilled therapy to return to prior level of function.       History and Personal Factors relevant to plan of care:  Previous LBP, asthma    Clinical Presentation  Evolving    Clinical Presentation due to:  No improvement of  pain    Clinical Decision Making  Moderate    Rehab Potential  Fair    Clinical Impairments Affecting Rehab Potential  (+) highly motivation (-) 3 months of unrelenting pain    PT Frequency  2x / week    PT Duration  6 weeks    PT Treatment/Interventions  Electrical Stimulation;Iontophoresis 4mg /ml Dexamethasone;Cryotherapy;Moist Heat;Ultrasound;Stair training;Gait training;Therapeutic activities;Therapeutic exercise;Balance training;Neuromuscular re-education;Manual techniques;Patient/family education;Passive range of motion;Dry needling    PT Next Visit Plan  Progress knee coordination and mobility    PT Home Exercise Plan  See education    Consulted and Agree with Plan of Care  Patient       Patient will benefit from skilled therapeutic intervention in order to improve the following deficits and impairments:  Abnormal gait, Decreased coordination, Decreased endurance, Decreased activity tolerance, Decreased range of motion, Decreased strength, Decreased balance, Difficulty walking, Hypomobility, Increased muscle spasms  Visit Diagnosis: Acute pain of left knee  Muscle weakness (generalized)     Problem List Patient Active Problem List   Diagnosis Date Noted  . Dysarthria 10/10/2014  . 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 LLQ pain 11/07/2013  . Carpal tunnel syndrome of right wrist 08/08/2013  . De Quervain's disease (radial styloid tenosynovitis) 05/10/2013  . Ganglion cyst of wrist 04/26/2013  . Chronic pelvic pain in female 05/23/2012   . History of sinus tachycardia 10/07/2011  . Migraine 10/07/2011  . Obesity, unspecified 10/07/2011  . Moderate obstructive sleep apnea 09/23/2011  . APL (antiphospholipid syndrome) (HCC) 11/05/2010  . Metabolic syndrome 11/04/2010  . Prediabetes 11/04/2010  . Prehypertension 11/04/2010  . Seasonal allergies 11/04/2010  . Vitamin D deficiency disease 11/04/2010  . Chronic asthma, mild persistent, uncomplicated 10/20/2010  . Polycystic ovarian syndrome 10/20/2010    Myrene Galas, PT DPT 05/12/2017, 7:23 PM   [facility] PHYSICAL AND SPORTS MEDICINE [address], [place], [address] Phone: [phone]   Fax:  [phone]  Name: Amber Ochoa MRN: [id number] Date of Birth: February 11, 1981

## 2017-05-19 ENCOUNTER — Ambulatory Visit: Payer: Medicaid Other

## 2017-05-19 DIAGNOSIS — M25562 Pain in left knee: Secondary | ICD-10-CM

## 2017-05-19 DIAGNOSIS — M6281 Muscle weakness (generalized): Secondary | ICD-10-CM

## 2017-05-19 NOTE — Therapy (Signed)
[place] [facility] PHYS[facility]ORTS MEDICINE [address], [place], [address] Phone: [phone]   Fax:  [phone]  Physical Therapy Treatment  Patient Details  Name: Amber Ochoa MRN: [id number] Date of Birth: 12-Jun-1980 Referring Provider: Juanell Fairly MD   Encounter Date: 05/19/2017  PT End of Session - 05/19/17 1913    Visit Number  2    Number of Visits  13    Date for PT Re-Evaluation  06/09/17    Authorization Type  MEDICAID    PT Start Time  1815    PT Stop Time  1908    PT Time Calculation (min)  53 min    Activity Tolerance  Patient tolerated treatment well    Behavior During Therapy  [facility] for tasks assessed/performed       Past Medical History:  Diagnosis Date  . Asthma   . Blood clotting disorder (HCC)   . Carpal tunnel syndrome of right wrist 08/08/2013  . Chronic asthma, mild persistent, uncomplicated 10/20/2010  . Chronic pelvic pain in female 05/23/2012  . De Quervain's disease (radial styloid tenosynovitis) 05/10/2013  . Dysarthria 10/10/2014  . Endometriosis   . Ganglion cyst of wrist 04/26/2013  . Headache   . Heart murmur   . 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 Migraine 10/07/2011  . Moderate obstructive sleep apnea 09/23/2011   Overview:  June 15, 2014: SPLIT-NIGHT PSG @ [facility]: overall AHI 26.2/hr. The patient had CPAP applied. The maximal observed pressure was: 11 cm H2O. The recommended pressure is: 11-16 cm H2O.  CPAP set up on 08/28/14 by [facility]  . PCOS (polycystic ovarian syndrome)   . Polycystic ovarian syndrome 10/20/2010  . Seasonal allergies 11/04/2010  . Tachycardia   . Vitamin D deficiency disease 11/04/2010    Past Surgical History:  Procedure Laterality Date  . ABDOMINAL HYSTERECTOMY    . GALLBLADDER SURGERY    . Tendonitis Right     There were no vitals filed for this visit.  Subjective Assessment -  05/19/17 1911    Subjective  Patient reports increased L knee pain and states it is feeling sore. Patient reports she conitnues to go to burn boot camp. Patient states pain feels about the same.     Pertinent History  History of LBP, Dequarivians, PCOS, clotting disorder, asthma     Limitations  House hold activities    Diagnostic tests  MRI: Increased swelling over the knee    Patient Stated Goals  Decrease pain, return to workout without modifications    Currently in Pain?  Yes    Pain Score  3     Pain Location  Knee    Pain Orientation  Left    Pain Descriptors / Indicators  Aching    Pain Type  Acute pain    Pain Onset  More than a month ago    Pain Frequency  Constant        TREATMENT:  Therapeutic Exercises: Isometric Hamstrings in sitting with 5 sec holds -- x 10  Quad sets in supine -- x10 with 5 sec holds; with small SLR Ball roll outs 2 x 4 min with 5# ankle weight Glute bridges with RTB around knees -- x 10  Isometric quadriceps holds in sitting -- x 10   Manual Therapy: STM to patients quadriceps and hamstrings to decrease increased pain and spasms within  the musculature utilizing superficial techniques.   Patient demonstrates decreased pain after performing ball roll outs.        PT Education - 05/19/17 1913    Education provided  Yes    Education Details  HEP: seated ball roll outs, SLR    Person(s) Educated  Patient    Methods  Explanation;Demonstration;Handout    Comprehension  Verbalized understanding;Returned demonstration          PT Long Term Goals - 05/12/17 1919      PT LONG TERM GOAL #1   Title  Patient will be independent with HEP to continue benefits of therapy after discharge    Baseline  Heavy cues for Ther ex performance    Time  6    Period  Weeks    Status  New    Target Date  06/23/17      PT LONG TERM GOAL #2   Title  Patient will have a worst pain of a 3/10 in the past week to indicate improvement at rest with knee pain.      Baseline  10/10 worst pain    Time  6    Period  Weeks    Status  New    Target Date  06/23/17      PT LONG TERM GOAL #3   Title  Patient will improve LEFS score to 80/80 to indicate improvement with running without increase in pain.     Baseline  74/80 LEFS    Time  6    Period  Weeks    Status  New    Target Date  06/23/17      PT LONG TERM GOAL #4   Title  Patient will be able to workout at her exercise class without increase in pain with activities.    Baseline  Increase in pain    Time  6    Period  Weeks    Status  New    Target Date  06/23/17            Plan - 05/19/17 1913    Clinical Impression Statement  Patient reports increased fatigue and soreness with performance of walking and weight bearing exercises. Focused on improving light strengthening and AROM exercises to improve coordination to return to prior level of function. Patient demonstrates improvement in quad activity after performing manual therapy and tactile cueing and patient will benefit from further skilled therapy to return to prior level of function.      Rehab Potential  Fair    Clinical Impairments Affecting Rehab Potential  (+) highly motivation (-) 3 months of unrelenting pain    PT Frequency  2x / week    PT Duration  6 weeks    PT Treatment/Interventions  Electrical Stimulation;Iontophoresis 4mg /ml Dexamethasone;Cryotherapy;Moist Heat;Ultrasound;Stair training;Gait training;Therapeutic activities;Therapeutic exercise;Balance training;Neuromuscular re-education;Manual techniques;Patient/family education;Passive range of motion;Dry needling    PT Next Visit Plan  Progress knee coordination and mobility    PT Home Exercise Plan  See education    Consulted and Agree with Plan of Care  Patient       Patient will benefit from skilled therapeutic intervention in order to improve the following deficits and impairments:  Abnormal gait, Decreased coordination, Decreased endurance, Decreased activity  tolerance, Decreased range of motion, Decreased strength, Decreased balance, Difficulty walking, Hypomobility, Increased muscle spasms  Visit Diagnosis: Acute pain of left knee  Muscle weakness (generalized)     Problem List Patient Active Problem List   Diagnosis Date  Noted  . Dysarthria 10/10/2014  . 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 LLQ pain 11/07/2013  . Carpal tunnel syndrome of right wrist 08/08/2013  . De Quervain's disease (radial styloid tenosynovitis) 05/10/2013  . Ganglion cyst of wrist 04/26/2013  . Chronic pelvic pain in female 05/23/2012  . History of sinus tachycardia 10/07/2011  . Migraine 10/07/2011  . Obesity, unspecified 10/07/2011  . Moderate obstructive sleep apnea 09/23/2011  . APL (antiphospholipid syndrome) (HCC) 11/05/2010  . Metabolic syndrome 11/04/2010  . Prediabetes 11/04/2010  . Prehypertension 11/04/2010  . Seasonal allergies 11/04/2010  . Vitamin D deficiency disease 11/04/2010  . Chronic asthma, mild persistent, uncomplicated 10/20/2010  . Polycystic ovarian syndrome 10/20/2010    Myrene GalasWesley Bentlee Benningfield, PT DPT 05/19/2017, 7:17 PM  [facility] [address] [place], [place], [id number] Phone: [phone]   Fax:  [phone]  Name: Amber Ochoa MRN: [id number] Date of Birth: 05/31/1980

## 2017-05-24 ENCOUNTER — Ambulatory Visit: Payer: Medicaid Other

## 2017-05-24 DIAGNOSIS — M25562 Pain in left knee: Secondary | ICD-10-CM | POA: Diagnosis not present

## 2017-05-24 DIAGNOSIS — M6281 Muscle weakness (generalized): Secondary | ICD-10-CM

## 2017-05-25 NOTE — Therapy (Signed)
[facility] PH[facility] MEDICINE [address] , [place], [address] Phone: [phone]   Fax:  [phone]  Physical Therapy Treatment  Patient Details  Name: Amber HaverKrystal Star Ochoa MRN: [id number] Date of Birth: 04/19/1980 Referring Provider: Juanell FairlyKevin Krasinski MD   Encounter Date: 05/24/2017  PT End of Session - 05/24/17 1910    Visit Number  3    Number of Visits  13    Date for PT Re-Evaluation  06/09/17    Authorization Type  MEDICAID    PT Start Time  1818    PT Stop Time  1905    PT Time Calculation (min)  47 min    Activity Tolerance  Patient tolerated treatment well    Behavior During Therapy  [facility] for tasks assessed/performed       Past Medical History:  Diagnosis Date  . Asthma   . Blood clotting disorder (HCC)   . Carpal tunnel syndrome of right wrist 08/08/2013  . Chronic asthma, mild persistent, uncomplicated 10/20/2010  . Chronic pelvic pain in female 05/23/2012  . De Quervain's disease (radial styloid tenosynovitis) 05/10/2013  . Dysarthria 10/10/2014  . Endometriosis   . Ganglion cyst of wrist 04/26/2013  . Headache   . Heart murmur   . 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 Migraine 10/07/2011  . Moderate obstructive sleep apnea 09/23/2011   Overview:  June 15, 2014: SPLIT-NIGHT PSG @ [facility]: overall AHI 26.2/hr. The patient had CPAP applied. The maximal observed pressure was: 11 cm H2O. The recommended pressure is: 11-16 cm H2O.  CPAP set up on 08/28/14 by [facility]  . PCOS (polycystic ovarian syndrome)   . Polycystic ovarian syndrome 10/20/2010  . Seasonal allergies 11/04/2010  . Tachycardia   . Vitamin D deficiency disease 11/04/2010    Past Surgical History:  Procedure Laterality Date  . ABDOMINAL HYSTERECTOMY    . GALLBLADDER SURGERY    . Tendonitis Right     There were no vitals filed for this visit.  Subjective Assessment -  05/24/17 1819    Subjective  Patient reports the knee continues to be in pain and is having increased soreness after her workout yesterday. Patient reports she has not jumped during exercise    Pertinent History  History of LBP, Dequarivians, PCOS, clotting disorder, asthma     Limitations  House hold activities    Diagnostic tests  MRI: Increased swelling over the knee    Patient Stated Goals  Decrease pain, return to workout without modifications    Currently in Pain?  Yes    Pain Score  4     Pain Location  Knee    Pain Orientation  Left    Pain Descriptors / Indicators  Aching    Pain Type  Acute pain    Pain Onset  More than a month ago    Pain Frequency  Constant         TREATMENT:   Therapeutic Exercises: Isometric Hamstrings in sitting with 5 sec holds -- x 10  Quad sets in supine - x20; with small SLR Ball roll outs -- x362min x 4 min with 5# ankle weight Hamstring curls with YTB - x 20 Knee extension with YTB - x 10 stopped due to pain  Leg Press at OMEGA - x20 20#, x20 15#, x20 10#     Manual Therapy: STM to patients quadriceps  and hamstrings to decrease increased pain and spasms within the musculature utilizing superficial techniques.    Patient demonstrates decreased pain after performing ball roll outs.    PT Education - 05/24/17 1909    Education provided  Yes    Education Details  HEP single leg stance, SLR    Person(s) Educated  Patient    Methods  Explanation;Demonstration    Comprehension  Verbalized understanding;Returned demonstration          PT Long Term Goals - 05/12/17 1919      PT LONG TERM GOAL #1   Title  Patient will be independent with HEP to continue benefits of therapy after discharge    Baseline  Heavy cues for Ther ex performance    Time  6    Period  Weeks    Status  New    Target Date  06/23/17      PT LONG TERM GOAL #2   Title  Patient will have a worst pain of a 3/10 in the past week to indicate improvement at rest with  knee pain.     Baseline  10/10 worst pain    Time  6    Period  Weeks    Status  New    Target Date  06/23/17      PT LONG TERM GOAL #3   Title  Patient will improve LEFS score to 80/80 to indicate improvement with running without increase in pain.     Baseline  74/80 LEFS    Time  6    Period  Weeks    Status  New    Target Date  06/23/17      PT LONG TERM GOAL #4   Title  Patient will be able to workout at her exercise class without increase in pain with activities.    Baseline  Increase in pain    Time  6    Period  Weeks    Status  New    Target Date  06/23/17            Plan - 05/25/17 2956    Clinical Impression Statement  Patient continues to have increased pain in the affected knee, but is able to go through a greater AROM before onset of pain with knee movement indicating improvement in strength and coordination. Patient continues to feel increased knee pain at rest and will benefit from further skilled therapy to return to prior level of function.     Rehab Potential  Fair    Clinical Impairments Affecting Rehab Potential  (+) highly motivation (-) 3 months of unrelenting pain    PT Frequency  2x / week    PT Duration  6 weeks    PT Treatment/Interventions  Electrical Stimulation;Iontophoresis 4mg /ml Dexamethasone;Cryotherapy;Moist Heat;Ultrasound;Stair training;Gait training;Therapeutic activities;Therapeutic exercise;Balance training;Neuromuscular re-education;Manual techniques;Patient/family education;Passive range of motion;Dry needling    PT Next Visit Plan  Progress knee coordination and mobility    PT Home Exercise Plan  See education    Consulted and Agree with Plan of Care  Patient       Patient will benefit from skilled therapeutic intervention in order to improve the following deficits and impairments:  Abnormal gait, Decreased coordination, Decreased endurance, Decreased activity tolerance, Decreased range of motion, Decreased strength, Decreased  balance, Difficulty walking, Hypomobility, Increased muscle spasms  Visit Diagnosis: Acute pain of left knee  Muscle weakness (generalized)     Problem List Patient Active Problem List   Diagnosis Date Noted  .  Dysarthria 10/10/2014  . 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 LLQ pain 11/07/2013  . Carpal tunnel syndrome of right wrist 08/08/2013  . De Quervain's disease (radial styloid tenosynovitis) 05/10/2013  . Ganglion cyst of wrist 04/26/2013  . Chronic pelvic pain in female 05/23/2012  . History of sinus tachycardia 10/07/2011  . Migraine 10/07/2011  . Obesity, unspecified 10/07/2011  . Moderate obstructive sleep apnea 09/23/2011  . APL (antiphospholipid syndrome) (HCC) 11/05/2010  . Metabolic syndrome 11/04/2010  . Prediabetes 11/04/2010  . Prehypertension 11/04/2010  . Seasonal allergies 11/04/2010  . Vitamin D deficiency disease 11/04/2010  . Chronic asthma, mild persistent, uncomplicated 10/20/2010  . Polycystic ovarian syndrome 10/20/2010    Myrene Galas, PT DPT 05/25/2017, 8:53 AM  [place] [facility] PHYSICAL AND SPORTS MEDICINE [address], [place], [address] Phone: [phone]   Fax:  [phone]  Name: Eh Sauseda MRN: [id number] Date of Birth: Nov 28, 1980

## 2017-05-26 ENCOUNTER — Ambulatory Visit: Payer: Medicaid Other

## 2017-05-26 DIAGNOSIS — M25562 Pain in left knee: Secondary | ICD-10-CM

## 2017-05-26 DIAGNOSIS — M6281 Muscle weakness (generalized): Secondary | ICD-10-CM

## 2017-05-26 NOTE — Therapy (Signed)
[place] [facility] [address] [place], [place], [address] Phone: [phone]   Fax:  [phone]  Physical Therapy Treatment  Patient Details  Name: Amber Ochoa MRN: [id number] Date of Birth: 03/15/1981 Referring Provider: Juanell FairlyKevin Krasinski MD   Encounter Date: 05/26/2017  PT End of Session - 05/26/17 1657    Visit Number  4    Number of Visits  13    Date for PT Re-Evaluation  06/09/17    Authorization Type  MEDICAID    PT Start Time  1645    PT Stop Time  1745    PT Time Calculation (min)  60 min    Activity Tolerance  Patient tolerated treatment well    Behavior During Therapy  [facility] for tasks assessed/performed       Past Medical History:  Diagnosis Date  . Asthma   . Blood clotting disorder (HCC)   . Carpal tunnel syndrome of right wrist 08/08/2013  . Chronic asthma, mild persistent, uncomplicated 10/20/2010  . Chronic pelvic pain in female 05/23/2012  . De Quervain's disease (radial styloid tenosynovitis) 05/10/2013  . Dysarthria 10/10/2014  . Endometriosis   . Ganglion cyst of wrist 04/26/2013  . Headache   . Heart murmur   . 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 Migraine 10/07/2011  . Moderate obstructive sleep apnea 09/23/2011   Overview:  June 15, 2014: SPLIT-NIGHT PSG @ [facility]: overall AHI 26.2/hr. The patient had CPAP applied. The maximal observed pressure was: 11 cm H2O. The recommended pressure is: 11-16 cm H2O.  CPAP set up on 08/28/14 by [facility]  . PCOS (polycystic ovarian syndrome)   . Polycystic ovarian syndrome 10/20/2010  . Seasonal allergies 11/04/2010  . Tachycardia   . Vitamin D deficiency disease 11/04/2010    Past Surgical History:  Procedure Laterality Date  . ABDOMINAL HYSTERECTOMY    . GALLBLADDER SURGERY    . Tendonitis Right     There were no vitals filed for this visit.  Subjective Assessment -  05/26/17 1647    Subjective  Pt rates L knee pain today as 5/10. Pt states it hurts more at work. Pt reports that worst pain in L knee still gets to 7/10.    Pertinent History  History of LBP, Dequarivians, PCOS, clotting disorder, asthma     Limitations  House hold activities    Diagnostic tests  MRI: Increased swelling over the knee    Patient Stated Goals  Decrease pain, return to workout without modifications    Currently in Pain?  Yes    Pain Score  5     Pain Location  Knee    Pain Orientation  Left    Pain Descriptors / Indicators  Aching    Pain Type  Acute pain    Pain Onset  More than a month ago    Pain Frequency  Constant         Treatment  Modalities (10min): with patient in sitting  One large moist heat pad placed along the L anterior knee with patient positioned in sitting to decrease pain and spasms within the musculature attaching to the L knee. High volt placed along the superior and inferior R and L sides of the patella to decrease increased pain and spasms; four large pads on R and L superior and inferior aspects of the L patella. Patient educated on  treatment.    TREATMENT:  Therapeutic Exercises:  Ball roll outs on L-- x70min, x64min 5# ankle weight to decrease pain of the L knee Plantarflexion with RTB on L - 2x30 to increase strength and endurance of the plantar flexors SLR on L- 2x15 to increase endurance of the L quad Side-lying hip abduction - 2x15 to increase endurance of the hip abductors Total gym; level 12, 1x15; level 2 1x10 to increase LE endurance Standing single leg press with BTB - 1x30 to increase strength and endurance of the L quad Single leg stance on airex pad on L LE- x3 min to promote single length strength and proprioception    Manual Therapy: with patient in supine  STM utilizing a superficial technique to the L quad just superior to the patella - 4x30 sec; to decrease pain    Pt demonstrates decreased pain at end of  session.      PT Long Term Goals - 05/12/17 1919      PT LONG TERM GOAL #1   Title  Patient will be independent with HEP to continue benefits of therapy after discharge    Baseline  Heavy cues for Ther ex performance    Time  6    Period  Weeks    Status  New    Target Date  06/23/17      PT LONG TERM GOAL #2   Title  Patient will have a worst pain of a 3/10 in the past week to indicate improvement at rest with knee pain.     Baseline  10/10 worst pain    Time  6    Period  Weeks    Status  New    Target Date  06/23/17      PT LONG TERM GOAL #3   Title  Patient will improve LEFS score to 80/80 to indicate improvement with running without increase in pain.     Baseline  74/80 LEFS    Time  6    Period  Weeks    Status  New    Target Date  06/23/17      PT LONG TERM GOAL #4   Title  Patient will be able to workout at her exercise class without increase in pain with activities.    Baseline  Increase in pain    Time  6    Period  Weeks    Status  New    Target Date  06/23/17            Plan - 05/26/17 1752    Clinical Impression Statement  Pt demonstrates improvement with worst pain decreasing from 10/10 to 7/10.  Although pt demonstrates improvement, she continues to have severe pain with performance of functional activities such as working out and standing at work. Pt continues to demonstrate increased fatigue with L LE SLR in supine (with decreased hip flexion AROM compared to R LE) and side lying hip abduction, indicating decreased strength and endurance of the L hip flexors and abductors.  Pt will benefit from further skilled therapy to return to prior level of function.    Rehab Potential  Fair    Clinical Impairments Affecting Rehab Potential  (+) highly motivation (-) 3 months of unrelenting pain    PT Frequency  2x / week    PT Duration  6 weeks    PT Treatment/Interventions  Electrical Stimulation;Iontophoresis 4mg /ml Dexamethasone;Cryotherapy;Moist  Heat;Ultrasound;Stair training;Gait training;Therapeutic activities;Therapeutic exercise;Balance training;Neuromuscular re-education;Manual techniques;Patient/family education;Passive range of motion;Dry needling  PT Next Visit Plan  Progress knee coordination and mobility    PT Home Exercise Plan  See education    Consulted and Agree with Plan of Care  Patient       Patient will benefit from skilled therapeutic intervention in order to improve the following deficits and impairments:  Abnormal gait, Decreased coordination, Decreased endurance, Decreased activity tolerance, Decreased range of motion, Decreased strength, Decreased balance, Difficulty walking, Hypomobility, Increased muscle spasms  Visit Diagnosis: Acute pain of left knee  Muscle weakness (generalized)     Problem List Patient Active Problem List   Diagnosis Date Noted  . Dysarthria 10/10/2014  . 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 LLQ pain 11/07/2013  . Carpal tunnel syndrome of right wrist 08/08/2013  . De Quervain's disease (radial styloid tenosynovitis) 05/10/2013  . Ganglion cyst of wrist 04/26/2013  . Chronic pelvic pain in female 05/23/2012  . History of sinus tachycardia 10/07/2011  . Migraine 10/07/2011  . Obesity, unspecified 10/07/2011  . Moderate obstructive sleep apnea 09/23/2011  . APL (antiphospholipid syndrome) (HCC) 11/05/2010  . Metabolic syndrome 11/04/2010  . Prediabetes 11/04/2010  . Prehypertension 11/04/2010  . Seasonal allergies 11/04/2010  . Vitamin D deficiency disease 11/04/2010  . Chronic asthma, mild persistent, uncomplicated 10/20/2010  . Polycystic ovarian syndrome 10/20/2010    Temple Pacini, SPT 05/26/2017, 5:59 PM  [place] [facility] PHYSICAL AND SPORTS MEDICINE [address] [place], [address] Phone: [phone]   Fax:  [phone]  Name: Amber Ochoa MRN: [id number] Date of Birth: 06-28-1980

## 2017-05-30 ENCOUNTER — Ambulatory Visit: Payer: Medicaid Other

## 2017-06-09 ENCOUNTER — Ambulatory Visit: Payer: Medicaid Other | Attending: Orthopedic Surgery

## 2017-06-09 DIAGNOSIS — M6281 Muscle weakness (generalized): Secondary | ICD-10-CM | POA: Diagnosis present

## 2017-06-09 DIAGNOSIS — M25562 Pain in left knee: Secondary | ICD-10-CM | POA: Insufficient documentation

## 2017-06-09 NOTE — Therapy (Signed)
[place] [facility] [address] [place], [address] Phone: [phone]   Fax:  [phone]  Physical Therapy Treatment  Patient Details  Name: Amber Ochoa MRN: [id number] Date of Birth: 08/25/1980 Referring Provider: Juanell Fairly MD   Encounter Date: 06/09/2017  PT End of Session - 06/09/17 1857    Visit Number  5    Number of Visits  13    Date for PT Re-Evaluation  06/09/17    Authorization Type  MEDICAID    PT Start Time  1815    PT Stop Time  1900    PT Time Calculation (min)  45 min    Activity Tolerance  Patient tolerated treatment well    Behavior During Therapy  [facility] for tasks assessed/performed       Past Medical History:  Diagnosis Date  . Asthma   . Blood clotting disorder (HCC)   . Carpal tunnel syndrome of right wrist 08/08/2013  . Chronic asthma, mild persistent, uncomplicated 10/20/2010  . Chronic pelvic pain in female 05/23/2012  . De Quervain's disease (radial styloid tenosynovitis) 05/10/2013  . Dysarthria 10/10/2014  . Endometriosis   . Ganglion cyst of wrist 04/26/2013  . Headache   . Heart murmur   . 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 Migraine 10/07/2011  . Moderate obstructive sleep apnea 09/23/2011   Overview:  June 15, 2014: SPLIT-NIGHT PSG @ [facility]: overall AHI 26.2/hr. The patient had CPAP applied. The maximal observed pressure was: 11 cm H2O. The recommended pressure is: 11-16 cm H2O.  CPAP set up on 08/28/14 by [facility]  . PCOS (polycystic ovarian syndrome)   . Polycystic ovarian syndrome 10/20/2010  . Seasonal allergies 11/04/2010  . Tachycardia   . Vitamin D deficiency disease 11/04/2010    Past Surgical History:  Procedure Laterality Date  . ABDOMINAL HYSTERECTOMY    . GALLBLADDER SURGERY    . Tendonitis Right     There were no vitals filed for this visit.  Subjective Assessment -  06/09/17 1855    Subjective  Patient reports she has been having increased pain along her knee cap. Patient reports her pain coming in today was a 4/10 and still has pain after exercising.    Pertinent History  History of LBP, Dequarivians, PCOS, clotting disorder, asthma     Limitations  House hold activities    Diagnostic tests  MRI: Increased swelling over the knee    Patient Stated Goals  Decrease pain, return to workout without modifications    Currently in Pain?  Yes    Pain Score  4     Pain Location  Knee    Pain Orientation  Left    Pain Descriptors / Indicators  Aching    Pain Type  Acute pain    Pain Onset  More than a month ago    Pain Frequency  Constant       TREATMENT:   Therapeutic Exercises:   Ball roll outs on L, x46min 5# ankle weight to decrease pain of the L knee Side-lying hip abduction - 2x15 to increase endurance of the hip abductors SLR on L- 2x15 to increase endurance of the L quad Running man in standing with UE support - X 10 B Isometric manual resistance leg press in hooklying - x 10  Isometric manual resistance hip extension in hooklying - x 10  Nustep at resistance level 1 - x5 min    Manual Therapy: with patient in supine   STM utilizing a superficial technique to the L quad just superior to the patella and throughout the quadriceps to decrease pain with patient in sitting      Pt demonstrates decreased pain at end of session.   PT Education - 06/09/17 1857    Education provided  Yes    Education Details  form/technique with exercise    Person(s) Educated  Patient    Methods  Explanation;Demonstration    Comprehension  Verbalized understanding;Returned demonstration          PT Long Term Goals - 05/12/17 1919      PT LONG TERM GOAL #1   Title  Patient will be independent with HEP to continue benefits of therapy after discharge    Baseline  Heavy cues for Ther ex performance    Time  6    Period  Weeks    Status  New    Target  Date  06/23/17      PT LONG TERM GOAL #2   Title  Patient will have a worst pain of a 3/10 in the past week to indicate improvement at rest with knee pain.     Baseline  10/10 worst pain    Time  6    Period  Weeks    Status  New    Target Date  06/23/17      PT LONG TERM GOAL #3   Title  Patient will improve LEFS score to 80/80 to indicate improvement with running without increase in pain.     Baseline  74/80 LEFS    Time  6    Period  Weeks    Status  New    Target Date  06/23/17      PT LONG TERM GOAL #4   Title  Patient will be able to workout at her exercise class without increase in pain with activities.    Baseline  Increase in pain    Time  6    Period  Weeks    Status  New    Target Date  06/23/17            Plan - 06/09/17 1857    Clinical Impression Statement  Patient demonstrates difficulty with stepping up onto a step indicating decreased quadriceps strength. Patient continues to have increased pain with straightening out knee at full knee extension indicating muscular coordination. Patient does demonstrate improvement in strength and will benefit from further skilled therapy focused on improving limitations to return to prior level of function.     Rehab Potential  Fair    Clinical Impairments Affecting Rehab Potential  (+) highly motivation (-) 3 months of unrelenting pain    PT Frequency  2x / week    PT Duration  6 weeks    PT Treatment/Interventions  Electrical Stimulation;Iontophoresis 4mg /ml Dexamethasone;Cryotherapy;Moist Heat;Ultrasound;Stair training;Gait training;Therapeutic activities;Therapeutic exercise;Balance training;Neuromuscular re-education;Manual techniques;Patient/family education;Passive range of motion;Dry needling    PT Next Visit Plan  Progress knee coordination and mobility    PT Home Exercise Plan  See education    Consulted and Agree with Plan of Care  Patient       Patient will benefit from skilled therapeutic intervention in  order to improve the following deficits and impairments:  Abnormal gait, Decreased coordination, Decreased endurance, Decreased activity tolerance, Decreased range of motion, Decreased strength, Decreased balance, Difficulty walking, Hypomobility, Increased muscle spasms  Visit Diagnosis: Acute pain of left knee  Muscle weakness (generalized)     Problem List Patient Active Problem List   Diagnosis Date Noted  . Dysarthria 10/10/2014  . 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 LLQ pain 11/07/2013  . Carpal tunnel syndrome of right wrist 08/08/2013  . De Quervain's disease (radial styloid tenosynovitis) 05/10/2013  . Ganglion cyst of wrist 04/26/2013  . Chronic pelvic pain in female 05/23/2012  . History of sinus tachycardia 10/07/2011  . Migraine 10/07/2011  . Obesity, unspecified 10/07/2011  . Moderate obstructive sleep apnea 09/23/2011  . APL (antiphospholipid syndrome) (HCC) 11/05/2010  . Metabolic syndrome 11/04/2010  . Prediabetes 11/04/2010  . Prehypertension 11/04/2010  . Seasonal allergies 11/04/2010  . Vitamin D deficiency disease 11/04/2010  . Chronic asthma, mild persistent, uncomplicated 10/20/2010  . Polycystic ovarian syndrome 10/20/2010    Myrene GalasWesley Omega Slager, PT DPT 06/09/2017, 7:06 PM  [place] [address] [place], [place], [address] Phone: [phone]   Fax:  [phone]  Name: Sarita HaverKrystal Star Sutphen MRN: [id number] Date of Birth: 03/26/1981

## 2017-06-14 ENCOUNTER — Ambulatory Visit: Payer: Medicaid Other

## 2017-06-14 DIAGNOSIS — M6281 Muscle weakness (generalized): Secondary | ICD-10-CM

## 2017-06-14 DIAGNOSIS — M25562 Pain in left knee: Secondary | ICD-10-CM | POA: Diagnosis not present

## 2017-06-15 NOTE — Therapy (Signed)
[facility] [address] [place], [address] Phone: [phone]   Fax:  [phone]  Physical Therapy Treatment  Patient Details  Name: Amber Ochoa MRN: [id number] Date of Birth: Aug 26, 1980 Referring Provider: Juanell Fairly MD   Encounter Date: 06/14/2017  PT End of Session - 06/14/17 1836    Visit Number  6    Number of Visits  13    Date for PT Re-Evaluation  06/23/17    Authorization Type  MEDICAID    PT Start Time  0623    PT Stop Time  0720    PT Time Calculation (min)  57 min    Activity Tolerance  Patient tolerated treatment well    Behavior During Therapy  [facility] for tasks assessed/performed       Past Medical History:  Diagnosis Date  . Asthma   . Blood clotting disorder (HCC)   . Carpal tunnel syndrome of right wrist 08/08/2013  . Chronic asthma, mild persistent, uncomplicated 10/20/2010  . Chronic pelvic pain in female 05/23/2012  . De Quervain's disease (radial styloid tenosynovitis) 05/10/2013  . Dysarthria 10/10/2014  . Endometriosis   . Ganglion cyst of wrist 04/26/2013  . Headache   . Heart murmur   . 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 Migraine 10/07/2011  . Moderate obstructive sleep apnea 09/23/2011   Overview:  June 15, 2014: SPLIT-NIGHT PSG @ [facility]: overall AHI 26.2/hr. The patient had CPAP applied. The maximal observed pressure was: 11 cm H2O. The recommended pressure is: 11-16 cm H2O.  CPAP set up on 08/28/14 by [facility]  . PCOS (polycystic ovarian syndrome)   . Polycystic ovarian syndrome 10/20/2010  . Seasonal allergies 11/04/2010  . Tachycardia   . Vitamin D deficiency disease 11/04/2010    Past Surgical History:  Procedure Laterality Date  . ABDOMINAL HYSTERECTOMY    . GALLBLADDER SURGERY    . Tendonitis Right     There were no vitals filed for this visit.  Subjective Assessment -  06/14/17 1828    Subjective  Patient reports increased pain on the L side and reports she had to stand on her affected side. Patient states her pain is a 5/10 today which is increased after burn bootcamp and working.     Pertinent History  History of LBP, Dequarivians, PCOS, clotting disorder, asthma     Limitations  House hold activities    Diagnostic tests  MRI: Increased swelling over the knee    Patient Stated Goals  Decrease pain, return to workout without modifications    Currently in Pain?  Yes    Pain Score  5     Pain Location  Knee    Pain Orientation  Left    Pain Descriptors / Indicators  Aching    Pain Type  Acute pain    Pain Onset  More than a month ago    Pain Frequency  Constant         Treatment   Modalities ( ): with patient in sitting   One ice pack placed along the L anterior knee with patient positioned in sitting to decrease pain and spasms within the musculature attaching to the L knee. High volt placed along the superior and inferior R and L sides of the patella to decrease increased pain and spasms; four large pads on R and L superior and inferior  aspects of the L patella. Patient educated on treatment.   Therapeutic Exercise: SLR in long sitting - 2 x 20  Hip abduction in sidelying - 2 x 10  Resisted dorflexion/plantarflexion - x 20  Seated knee flexion/ext with 5# weight against weighted ball - x 20 Manual Therapy STM to quad, calf, tibialis anterior with patient positioned in long sitting to decrease increased pain and spasms along her affected LE and decreased muscular guarding utilizing superficial techniques. Patient demonstrates increased fatigue at end of the session     PT Education - 06/14/17 1835    Education provided  Yes    Education Details  form/technique with exercise    Person(s) Educated  Patient    Methods  Explanation;Demonstration    Comprehension  Verbalized understanding;Returned demonstration          PT Long Term  Goals - 05/12/17 1919      PT LONG TERM GOAL #1   Title  Patient will be independent with HEP to continue benefits of therapy after discharge    Baseline  Heavy cues for Ther ex performance    Time  6    Period  Weeks    Status  New    Target Date  06/23/17      PT LONG TERM GOAL #2   Title  Patient will have a worst pain of a 3/10 in the past week to indicate improvement at rest with knee pain.     Baseline  10/10 worst pain    Time  6    Period  Weeks    Status  New    Target Date  06/23/17      PT LONG TERM GOAL #3   Title  Patient will improve LEFS score to 80/80 to indicate improvement with running without increase in pain.     Baseline  74/80 LEFS    Time  6    Period  Weeks    Status  New    Target Date  06/23/17      PT LONG TERM GOAL #4   Title  Patient will be able to workout at her exercise class without increase in pain with activities.    Baseline  Increase in pain    Time  6    Period  Weeks    Status  New    Target Date  06/23/17            Plan - 06/15/17 1625    Clinical Impression Statement  Patient demonstrates decreased quadriceps strength and performed exercises in non weight bearing positions to not aggravate pain. Patient demonstrates increased pain along calf and anterior aspect of the lower leg. Patient demonstrates improvement with ability to perform greater amoount of repetitions compared to the previous sesssion. PAtient will benefit from further skiled therapy to return to prior level of function.     Rehab Potential  Fair    Clinical Impairments Affecting Rehab Potential  (+) highly motivation (-) 3 months of unrelenting pain    PT Frequency  2x / week    PT Duration  6 weeks    PT Treatment/Interventions  Electrical Stimulation;Iontophoresis 4mg /ml Dexamethasone;Cryotherapy;Moist Heat;Ultrasound;Stair training;Gait training;Therapeutic activities;Therapeutic exercise;Balance training;Neuromuscular re-education;Manual  techniques;Patient/family education;Passive range of motion;Dry needling    PT Next Visit Plan  Progress knee coordination and mobility    PT Home Exercise Plan  See education    Consulted and Agree with Plan of Care  Patient  Patient will benefit from skilled therapeutic intervention in order to improve the following deficits and impairments:  Abnormal gait, Decreased coordination, Decreased endurance, Decreased activity tolerance, Decreased range of motion, Decreased strength, Decreased balance, Difficulty walking, Hypomobility, Increased muscle spasms  Visit Diagnosis: Acute pain of left knee  Muscle weakness (generalized)     Problem List Patient Active Problem List   Diagnosis Date Noted  . Dysarthria 10/10/2014  . 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 LLQ pain 11/07/2013  . Carpal tunnel syndrome of right wrist 08/08/2013  . De Quervain's disease (radial styloid tenosynovitis) 05/10/2013  . Ganglion cyst of wrist 04/26/2013  . Chronic pelvic pain in female 05/23/2012  . History of sinus tachycardia 10/07/2011  . Migraine 10/07/2011  . Obesity, unspecified 10/07/2011  . Moderate obstructive sleep apnea 09/23/2011  . APL (antiphospholipid syndrome) (HCC) 11/05/2010  . Metabolic syndrome 11/04/2010  . Prediabetes 11/04/2010  . Prehypertension 11/04/2010  . Seasonal allergies 11/04/2010  . Vitamin D deficiency disease 11/04/2010  . Chronic asthma, mild persistent, uncomplicated 10/20/2010  . Polycystic ovarian syndrome 10/20/2010    Myrene GalasWesley Kojo Liby, PT DPT 06/15/2017, 4:31 PM  [facility] [address] [place], [place], [id number] Phone: [phone]   Fax:  [phone]  Name: Amber Ochoa MRN: [id number] Date of Birth: 09/12/1980

## 2017-06-16 ENCOUNTER — Ambulatory Visit: Payer: Medicaid Other

## 2017-06-16 DIAGNOSIS — M6281 Muscle weakness (generalized): Secondary | ICD-10-CM

## 2017-06-16 DIAGNOSIS — M25562 Pain in left knee: Secondary | ICD-10-CM | POA: Diagnosis not present

## 2017-06-16 NOTE — Therapy (Signed)
[facility] [address], [place], [address] Phone: [phone]   Fax:  [phone]  Physical Therapy Treatment  Patient Details  Name: Amber Ochoa MRN: [id number] Date of Birth: 26-Jun-1980 Referring Provider: Juanell Fairly MD   Encounter Date: 06/16/2017  PT End of Session - 06/16/17 1406    Visit Number  7    Number of Visits  13    Date for PT Re-Evaluation  06/23/17    Authorization Type  MEDICAID    PT Start Time  1110    PT Stop Time  1210    PT Time Calculation (min)  60 min    Activity Tolerance  Patient tolerated treatment well    Behavior During Therapy  [facility] for tasks assessed/performed       Past Medical History:  Diagnosis Date  . Asthma   . Blood clotting disorder (HCC)   . Carpal tunnel syndrome of right wrist 08/08/2013  . Chronic asthma, mild persistent, uncomplicated 10/20/2010  . Chronic pelvic pain in female 05/23/2012  . De Quervain's disease (radial styloid tenosynovitis) 05/10/2013  . Dysarthria 10/10/2014  . Endometriosis   . Ganglion cyst of wrist 04/26/2013  . Headache   . Heart murmur   . 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 Migraine 10/07/2011  . Moderate obstructive sleep apnea 09/23/2011   Overview:  June 15, 2014: SPLIT-NIGHT PSG @ [facility]: overall AHI 26.2/hr. The patient had CPAP applied. The maximal observed pressure was: 11 cm H2O. The recommended pressure is: 11-16 cm H2O.  CPAP set up on 08/28/14 by [facility]  . PCOS (polycystic ovarian syndrome)   . Polycystic ovarian syndrome 10/20/2010  . Seasonal allergies 11/04/2010  . Tachycardia   . Vitamin D deficiency disease 11/04/2010    Past Surgical History:  Procedure Laterality Date  . ABDOMINAL HYSTERECTOMY    . GALLBLADDER SURGERY    . Tendonitis Right     There were no vitals filed for this visit.  Subjective Assessment -  06/16/17 1401    Subjective  Patient report she continues to have increased knee pain. Patient states she has an overall 5/10 knee pain and reports increased pain along the posterior/lateral aspect of her calf.     Pertinent History  History of LBP, Dequarivians, PCOS, clotting disorder, asthma     Limitations  House hold activities    Diagnostic tests  MRI: Increased swelling over the knee    Patient Stated Goals  Decrease pain, return to workout without modifications    Currently in Pain?  Yes    Pain Score  5     Pain Location  Knee leg    Pain Orientation  Left    Pain Descriptors / Indicators  Aching    Pain Type  Acute pain    Pain Onset  More than a month ago    Pain Frequency  Constant       TREATMENT Therapeutic Exercise: Wedding marches with 10# weight and GTB around knees - 6 x 82ft Standing calf stretch - 2 x 30 sec Standing single leg heel raises - 2 x 5 Standing hip abduction pressing ball into wall - x20 Knee flexion/extension AROM in sitting - x20  Manual Therapy: STM performed to quadriceps and gastrocnemius with patient positioned in long sitting to improve upon muscular guarding and pain.   Modalities ( ):  with patient in sitting One ice pack placed along the L anterior knee with patient positioned in sitting to decrease pain and spasms within the musculature attaching to the L knee. High volt placed along the superior and inferior R and L sides of the patella to decrease increased pain and spasms; four large pads on R and L superior and inferior aspects of the L patella. Patient educated on treatment.   Dry Needling(624min) : One 100mm x .4mm needle placed into the gastrocnemius to help decrease calf pain and spasms. Patient was educated on risks of the therapy to be performed with dry needling. Patient states she felt increased soreness with exercise.  Patient demonstrates increased fatigue at end of the session.  PT Education - 06/16/17 1405    Education  provided  Yes    Education Details  form/technique with exercise    Person(s) Educated  Patient    Methods  Explanation;Demonstration    Comprehension  Verbalized understanding;Returned demonstration          PT Long Term Goals - 05/12/17 1919      PT LONG TERM GOAL #1   Title  Patient will be independent with HEP to continue benefits of therapy after discharge    Baseline  Heavy cues for Ther ex performance    Time  6    Period  Weeks    Status  New    Target Date  06/23/17      PT LONG TERM GOAL #2   Title  Patient will have a worst pain of a 3/10 in the past week to indicate improvement at rest with knee pain.     Baseline  10/10 worst pain    Time  6    Period  Weeks    Status  New    Target Date  06/23/17      PT LONG TERM GOAL #3   Title  Patient will improve LEFS score to 80/80 to indicate improvement with running without increase in pain.     Baseline  74/80 LEFS    Time  6    Period  Weeks    Status  New    Target Date  06/23/17      PT LONG TERM GOAL #4   Title  Patient will be able to workout at her exercise class without increase in pain with activities.    Baseline  Increase in pain    Time  6    Period  Weeks    Status  New    Target Date  06/23/17            Plan - 06/16/17 1409    Clinical Impression Statement  Patient demonstrates decreased pain and spasms after performing modalities treatment and dry needling intervention indicating decreased muscular spasms and pain. Been attempting to improve LE strength through pain-free ways, but having difficutly secondary to knee being sensitive to all knee motions. Patient demonstrates overall less pain compared to start of therapy but continues to have pain with performing knee flexion and extension. Patient will benefit from further skilled therapy to return to prior level of function.      Rehab Potential  Fair    Clinical Impairments Affecting Rehab Potential  (+) highly motivation (-) 3 months of  unrelenting pain    PT Frequency  2x / week    PT Duration  6 weeks    PT Treatment/Interventions  Electrical Stimulation;Iontophoresis 4mg /ml Dexamethasone;Cryotherapy;Moist Heat;Ultrasound;Stair training;Gait training;Therapeutic activities;Therapeutic exercise;Balance  training;Neuromuscular re-education;Manual techniques;Patient/family education;Passive range of motion;Dry needling    PT Next Visit Plan  Progress knee coordination and mobility    PT Home Exercise Plan  See education    Consulted and Agree with Plan of Care  Patient       Patient will benefit from skilled therapeutic intervention in order to improve the following deficits and impairments:  Abnormal gait, Decreased coordination, Decreased endurance, Decreased activity tolerance, Decreased range of motion, Decreased strength, Decreased balance, Difficulty walking, Hypomobility, Increased muscle spasms  Visit Diagnosis: Acute pain of left knee  Muscle weakness (generalized)     Problem List Patient Active Problem List   Diagnosis Date Noted  . Dysarthria 10/10/2014  . 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 LLQ pain 11/07/2013  . Carpal tunnel syndrome of right wrist 08/08/2013  . De Quervain's disease (radial styloid tenosynovitis) 05/10/2013  . Ganglion cyst of wrist 04/26/2013  . Chronic pelvic pain in female 05/23/2012  . History of sinus tachycardia 10/07/2011  . Migraine 10/07/2011  . Obesity, unspecified 10/07/2011  . Moderate obstructive sleep apnea 09/23/2011  . APL (antiphospholipid syndrome) (HCC) 11/05/2010  . Metabolic syndrome 11/04/2010  . Prediabetes 11/04/2010  . Prehypertension 11/04/2010  . Seasonal allergies 11/04/2010  . Vitamin D deficiency disease 11/04/2010  . Chronic asthma, mild persistent, uncomplicated 10/20/2010  . Polycystic ovarian syndrome 10/20/2010    Myrene Galas, PT DPT 06/16/2017, 2:20 PM  [facility] PHYS[facility] AND  SPORTS MEDICINE [address], [place], [address] Phone: [phone]   Fax:  [phone]  Name: Amber Ochoa MRN: [id number] Date of Birth: 22-Feb-1981

## 2017-06-21 ENCOUNTER — Ambulatory Visit: Payer: Medicaid Other

## 2017-06-21 DIAGNOSIS — M25562 Pain in left knee: Secondary | ICD-10-CM | POA: Diagnosis not present

## 2017-06-21 DIAGNOSIS — M6281 Muscle weakness (generalized): Secondary | ICD-10-CM

## 2017-06-21 NOTE — Therapy (Signed)
Amber Ochoa MRN: [id number] Date of Birth: 05/31/1980 Referring Provider: Juanell FairlyKevin Krasinski MD   Encounter Date: 06/21/2017  PT End of Session - 06/21/17 1911    Visit Number  8    Number of Visits  13    Date for PT Re-Evaluation  06/23/17    Authorization Type  MEDICAID    PT Start Time  1830    PT Stop Time  1930    PT Time Calculation (min)  60 min    Activity Tolerance  Patient tolerated treatment well    Behavior During Therapy  [facility] for tasks assessed/performed       Past Medical History:  Diagnosis Date  . Asthma   . Blood clotting disorder (HCC)   . Carpal tunnel syndrome of right wrist 08/08/2013  . Chronic asthma, mild persistent, uncomplicated 10/20/2010  . Chronic pelvic pain in female 05/23/2012  . De Quervain's disease (radial styloid tenosynovitis) 05/10/2013  . Dysarthria 10/10/2014  . Endometriosis   . Ganglion cyst of wrist 04/26/2013  . Headache   . Heart murmur   . 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 Migraine 10/07/2011  . Moderate obstructive sleep apnea 09/23/2011   Overview:  June 15, 2014: SPLIT-NIGHT PSG @ [facility]: overall AHI 26.2/hr. The patient had CPAP applied. The maximal observed pressure was: 11 cm H2O. The recommended pressure is: 11-16 cm H2O.  CPAP set up on 08/28/14 by [facility]  . PCOS (polycystic ovarian syndrome)   . Polycystic ovarian syndrome 10/20/2010  . Seasonal allergies 11/04/2010  . Tachycardia   . Vitamin D deficiency disease 11/04/2010    Past Surgical History:  Procedure Laterality Date  . ABDOMINAL HYSTERECTOMY    . GALLBLADDER SURGERY    . Tendonitis Right     There were no vitals filed for this visit.  Subjective Assessment -  06/21/17 1902    Subjective  Patient reports she continues to have pain and states she has been performing her exercises but continues to go to gym for burn boot camp.    Pertinent History  History of LBP, Dequarivians, PCOS, clotting disorder, asthma     Limitations  House hold activities    Diagnostic tests  MRI: Increased swelling over the knee    Patient Stated Goals  Decrease pain, return to workout without modifications    Currently in Pain?  Yes    Pain Score  5     Pain Location  Knee    Pain Orientation  Left    Pain Descriptors / Indicators  Aching    Pain Type  Acute pain    Pain Onset  More than a month ago    Pain Frequency  Constant        TREATMENT Therapeutic Exercise: Single leg stance mini squats with TRX -- x 20  Leg Press eccentrics -- 85# x 20; 75# x 20  Resisted isometric holds in sitting knee flexion/extension -- 5 sec x20   Manual Therapy: STM performed to quadriceps and gastrocnemius with patient positioned in long sitting to improve upon muscular guarding and pain.   Modalities (20min): with patient in sitting Oneice pack placed along the L anterior knee with patient positioned in sitting  to decrease pain and spasms within the musculature attaching to the L knee. High volt placed along the superior and inferior R and L sides of the patella to decrease increased pain and spasms;fourlarge pads on R and L superior and inferior aspects of the L patella. Patient educated on treatment.     PT Education - 06/21/17 1910    Education provided  Yes    Education Details  form/technique with exercise    Person(s) Educated  Patient    Methods  Explanation;Demonstration    Comprehension  Verbalized understanding;Returned demonstration          PT Long Term Goals - 05/12/17 1919      PT LONG TERM GOAL #1   Title  Patient will be independent with HEP to continue benefits of therapy after discharge    Baseline  Heavy cues for Ther ex performance     Time  6    Period  Weeks    Status  New    Target Date  06/23/17      PT LONG TERM GOAL #2   Title  Patient will have a worst pain of a 3/10 in the past week to indicate improvement at rest with knee pain.     Baseline  10/10 worst pain    Time  6    Period  Weeks    Status  New    Target Date  06/23/17      PT LONG TERM GOAL #3   Title  Patient will improve LEFS score to 80/80 to indicate improvement with running without increase in pain.     Baseline  74/80 LEFS    Time  6    Period  Weeks    Status  New    Target Date  06/23/17      PT LONG TERM GOAL #4   Title  Patient will be able to workout at her exercise class without increase in pain with activities.    Baseline  Increase in pain    Time  6    Period  Weeks    Status  New    Target Date  06/23/17            Plan - 06/21/17 1912    Clinical Impression Statement  Patient demonstrates ability to perform exercises but has increased pain after performing eccentrics. Performed greater resistance with exercises today as patient continues to have increased pain. Patient demonstrates ability to perform the exercises but has increased pain at the end of the session. Patient will benefit from further skilled therapy to return to prior level of function.     Rehab Potential  Fair    Clinical Impairments Affecting Rehab Potential  (+) highly motivation (-) 3 months of unrelenting pain    PT Frequency  2x / week    PT Duration  6 weeks    PT Treatment/Interventions  Electrical Stimulation;Iontophoresis 4mg /ml Dexamethasone;Cryotherapy;Moist Heat;Ultrasound;Stair training;Gait training;Therapeutic activities;Therapeutic exercise;Balance training;Neuromuscular re-education;Manual techniques;Patient/family education;Passive range of motion;Dry needling    PT Next Visit Plan  Progress knee coordination and mobility    PT Home Exercise Plan  See education    Consulted and Agree with Plan of Care  Patient       Patient will  benefit from skilled therapeutic intervention in order to improve the following deficits and impairments:  Abnormal gait, Decreased coordination, Decreased endurance, Decreased activity tolerance, Decreased range of motion, Decreased strength, Decreased balance, Difficulty walking, Hypomobility, Increased muscle spasms  Visit Diagnosis: Acute pain of left knee  Muscle weakness (generalized)     Problem List Patient Active Problem List   Diagnosis Date Noted  . Dysarthria 10/10/2014  . 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 LLQ pain 11/07/2013  . Carpal tunnel syndrome of right wrist 08/08/2013  . De Quervain's disease (radial styloid tenosynovitis) 05/10/2013  . Ganglion cyst of wrist 04/26/2013  . Chronic pelvic pain in female 05/23/2012  . History of sinus tachycardia 10/07/2011  . Migraine 10/07/2011  . Obesity, unspecified 10/07/2011  . Moderate obstructive sleep apnea 09/23/2011  . APL (antiphospholipid syndrome) (HCC) 11/05/2010  . Metabolic syndrome 11/04/2010  . Prediabetes 11/04/2010  . Prehypertension 11/04/2010  . Seasonal allergies 11/04/2010  . Vitamin D deficiency disease 11/04/2010  . Chronic asthma, mild persistent, uncomplicated 10/20/2010  . Polycystic ovarian syndrome 10/20/2010    Myrene Galas, PT DPT 06/21/2017, 7:17 PM  [place] [facility] PHYSICAL AND SPORTS MEDICINE [address], [place], [address] Phone: [phone]   Fax:  [phone]  Name: Amber Ochoa MRN: [id number] Date of Birth: 01-31-1981

## 2017-06-23 ENCOUNTER — Ambulatory Visit: Payer: Medicaid Other

## 2017-06-23 DIAGNOSIS — M25562 Pain in left knee: Secondary | ICD-10-CM | POA: Diagnosis not present

## 2017-06-23 DIAGNOSIS — M6281 Muscle weakness (generalized): Secondary | ICD-10-CM

## 2017-06-23 NOTE — Therapy (Signed)
[facility] PHYSICAL AND SPORTS MEDICINE [address] [place], [place], [address] Phone: [phone]   Fax:  [phone]  Physical Therapy Treatment  Patient Details  Name: Amber HaverKrystal Star Willy MRN: [id number] Date of Birth: 05/05/1980 Referring Provider: Juanell FairlyKevin Krasinski MD   Encounter Date: 06/23/2017  PT End of Session - 06/23/17 1836    Visit Number  9    Number of Visits  13    Date for PT Re-Evaluation  06/23/17    Authorization Type  MEDICAID    PT Start Time  1745    PT Stop Time  1845    PT Time Calculation (min)  60 min    Activity Tolerance  Patient tolerated treatment well    Behavior During Therapy  [facility] for tasks assessed/performed       Past Medical History:  Diagnosis Date  . Asthma   . Blood clotting disorder (HCC)   . Carpal tunnel syndrome of right wrist 08/08/2013  . Chronic asthma, mild persistent, uncomplicated 10/20/2010  . Chronic pelvic pain in female 05/23/2012  . De Quervain's disease (radial styloid tenosynovitis) 05/10/2013  . Dysarthria 10/10/2014  . Endometriosis   . Ganglion cyst of wrist 04/26/2013  . Headache   . Heart murmur   . 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 Migraine 10/07/2011  . Moderate obstructive sleep apnea 09/23/2011   Overview:  June 15, 2014: SPLIT-NIGHT PSG @ [facility]: overall AHI 26.2/hr. The patient had CPAP applied. The maximal observed pressure was: 11 cm H2O. The recommended pressure is: 11-16 cm H2O.  CPAP set up on 08/28/14 by [employer]  . PCOS (polycystic ovarian syndrome)   . Polycystic ovarian syndrome 10/20/2010  . Seasonal allergies 11/04/2010  . Tachycardia   . Vitamin D deficiency disease 11/04/2010    Past Surgical History:  Procedure Laterality Date  . ABDOMINAL HYSTERECTOMY    . GALLBLADDER SURGERY    . Tendonitis Right     There were no vitals filed for this visit.  Subjective Assessment -  06/23/17 1829    Subjective  Patient reports she continues to have increased knee pain but states increased pain when walking and bending her knee. Patient reports she went to the zoo and increased knee pain.     Pertinent History  History of LBP, Dequarivians, PCOS, clotting disorder, asthma     Limitations  House hold activities    Diagnostic tests  MRI: Increased swelling over the knee    Patient Stated Goals  Decrease pain, return to workout without modifications    Currently in Pain?  Yes    Pain Score  4     Pain Location  Knee    Pain Orientation  Left    Pain Descriptors / Indicators  Aching    Pain Type  Acute pain    Pain Onset  More than a month ago    Pain Frequency  Constant       TREATMENT Therapeutic Exercise: -Supine lying with SAQ and bolster under knee x20 Supine knees to chest -- x 7 min to decrease swelling    Manual Therapy: STM performed to quadriceps and gastrocnemius with patient positioned in long sitting to improve upon muscular guarding and pain.  Patella mobilizations with knee mobilizer -- 5 min forward backward  Dry Needling(293min) : One 100mm x .4mm needle placed into the quadricepsto help decrease quad pain  and spasms. Patient was educated on risks of the therapy to be performed with dry needling. Patient states she felt increased soreness with exercise.    Modalities ( ): with patient in sitting Oneice pack placed along the L anterior knee with patient positioned in sitting to decrease pain and spasms within the musculature attaching to the L knee. High volt placed along the superior and inferior R and L sides of the patella to decrease increased pain and spasms;fourlarge pads on R and L superior and inferior aspects of the L patella. Patient educated on treatment.    PT Education - 06/23/17 1835    Education provided  Yes    Education Details  form/technique with exercise    Person(s) Educated  Patient    Methods   Explanation;Demonstration    Comprehension  Verbalized understanding;Returned demonstration          PT Long Term Goals - 05/12/17 1919      PT LONG TERM GOAL #1   Title  Patient will be independent with HEP to continue benefits of therapy after discharge    Baseline  Heavy cues for Ther ex performance    Time  6    Period  Weeks    Status  New    Target Date  06/23/17      PT LONG TERM GOAL #2   Title  Patient will have a worst pain of a 3/10 in the past week to indicate improvement at rest with knee pain.     Baseline  10/10 worst pain    Time  6    Period  Weeks    Status  New    Target Date  06/23/17      PT LONG TERM GOAL #3   Title  Patient will improve LEFS score to 80/80 to indicate improvement with running without increase in pain.     Baseline  74/80 LEFS    Time  6    Period  Weeks    Status  New    Target Date  06/23/17      PT LONG TERM GOAL #4   Title  Patient will be able to workout at her exercise class without increase in pain with activities.    Baseline  Increase in pain    Time  6    Period  Weeks    Status  New    Target Date  06/23/17            Plan - 06/23/17 1838    Clinical Impression Statement  Patient demonstrates improved knee flexion angle compared to the previous session. Patient demonstrates improvement with decreased pain and spasms after performing modalities and manual therapy. Patient will benefit from further skilled therapy to return to prior level of function.      Rehab Potential  Fair    Clinical Impairments Affecting Rehab Potential  (+) highly motivation (-) 3 months of unrelenting pain    PT Frequency  2x / week    PT Duration  6 weeks    PT Treatment/Interventions  Electrical Stimulation;Iontophoresis 4mg /ml Dexamethasone;Cryotherapy;Moist Heat;Ultrasound;Stair training;Gait training;Therapeutic activities;Therapeutic exercise;Balance training;Neuromuscular re-education;Manual techniques;Patient/family  education;Passive range of motion;Dry needling    PT Next Visit Plan  Progress knee coordination and mobility    PT Home Exercise Plan  See education    Consulted and Agree with Plan of Care  Patient       Patient will benefit from skilled therapeutic intervention in order to improve the following  deficits and impairments:  Abnormal gait, Decreased coordination, Decreased endurance, Decreased activity tolerance, Decreased range of motion, Decreased strength, Decreased balance, Difficulty walking, Hypomobility, Increased muscle spasms  Visit Diagnosis: Acute pain of left knee  Muscle weakness (generalized)     Problem List Patient Active Problem List   Diagnosis Date Noted  . Dysarthria 10/10/2014  . 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 LLQ pain 11/07/2013  . Carpal tunnel syndrome of right wrist 08/08/2013  . De Quervain's disease (radial styloid tenosynovitis) 05/10/2013  . Ganglion cyst of wrist 04/26/2013  . Chronic pelvic pain in female 05/23/2012  . History of sinus tachycardia 10/07/2011  . Migraine 10/07/2011  . Obesity, unspecified 10/07/2011  . Moderate obstructive sleep apnea 09/23/2011  . APL (antiphospholipid syndrome) (HCC) 11/05/2010  . Metabolic syndrome 11/04/2010  . Prediabetes 11/04/2010  . Prehypertension 11/04/2010  . Seasonal allergies 11/04/2010  . Vitamin D deficiency disease 11/04/2010  . Chronic asthma, mild persistent, uncomplicated 10/20/2010  . Polycystic ovarian syndrome 10/20/2010    Myrene Galas, PT DPT 06/23/2017, 6:53 PM  [facility] PHYSICAL AND SPORTS MEDICINE [address] [place], [address] Phone: [phone]   Fax:  [phone]  Name: Sadey Yandell MRN: [id number] Date of Birth: 09-25-1980

## 2017-06-28 ENCOUNTER — Ambulatory Visit: Payer: Medicaid Other | Attending: Orthopedic Surgery

## 2017-06-28 DIAGNOSIS — M25562 Pain in left knee: Secondary | ICD-10-CM | POA: Diagnosis present

## 2017-06-28 DIAGNOSIS — M6281 Muscle weakness (generalized): Secondary | ICD-10-CM | POA: Diagnosis not present

## 2017-06-28 NOTE — Therapy (Signed)
[place] [facility] [address] [place], [address] Phone: [phone]   Fax:  [phone]  Physical Therapy Treatment  Patient Details  Name: Amber Ochoa MRN: [id number] Date of Birth: [birth year] Referring Provider: Juanell Fairly MD   Encounter Date: 06/28/2017  PT End of Session - 06/28/17 1814    Visit Number  10    Number of Visits  13    Date for PT Re-Evaluation  07/26/17    Authorization Type  MEDICAID    PT Start Time  1745    PT Stop Time  1845    PT Time Calculation (min)  60 min    Activity Tolerance  Patient tolerated treatment well    Behavior During Therapy  [facility] for tasks assessed/performed       Past Medical History:  Diagnosis Date  . Asthma   . Blood clotting disorder (HCC)   . Carpal tunnel syndrome of right wrist 08/08/2013  . Chronic asthma, mild persistent, uncomplicated 10/20/2010  . Chronic pelvic pain in female 05/23/2012  . De Quervain's disease (radial styloid tenosynovitis) 05/10/2013  . Dysarthria 10/10/2014  . Endometriosis   . Ganglion cyst of wrist 04/26/2013  . Headache   . Heart murmur   . 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 Migraine 10/07/2011  . Moderate obstructive sleep apnea 09/23/2011   Overview:  June 15, 2014: SPLIT-NIGHT PSG @ [facility]: overall AHI 26.2/hr. The patient had CPAP applied. The maximal observed pressure was: 11 cm H2O. The recommended pressure is: 11-16 cm H2O.  CPAP set up on 08/28/14 by [facility]  . PCOS (polycystic ovarian syndrome)   . Polycystic ovarian syndrome 10/20/2010  . Seasonal allergies 11/04/2010  . Tachycardia   . Vitamin D deficiency disease 11/04/2010    Past Surgical History:  Procedure Laterality Date  . ABDOMINAL HYSTERECTOMY    . GALLBLADDER SURGERY    . Tendonitis Right     There were no vitals filed for this visit.  Subjective Assessment -  06/28/17 1803    Subjective  Patient reports her pain has improved since the first session but conitnues to have pain in her knee. Patient states she continues to have knee pain and has been performing intense exercise with the BURN bootcamp.     Pertinent History  History of LBP, Dequarivians, PCOS, clotting disorder, asthma     Limitations  House hold activities    Diagnostic tests  MRI: Increased swelling over the knee    Patient Stated Goals  Decrease pain, return to workout without modifications    Currently in Pain?  Yes    Pain Score  6     Pain Location  Knee    Pain Orientation  Left    Pain Descriptors / Indicators  Aching    Pain Type  Acute pain    Pain Onset  More than a month ago    Pain Frequency  Constant       TREATMENT Therapeutic Exercise: -Sitting with LAQ 5# weight - x 20  -Standing squats with TRX straps - x 20  -Standing wall sits - 2 x 45sec -Weight shifts forward/backward into tandem stance - x 35 performed with L LE behind - Leg Press unilaterally 35# -- 2 x 20    Manual Therapy: STM performed to quadriceps and gastrocnemius with patient positioned in long sitting to  improve upon muscular guarding and pain.  Patella mobilizations with patient in long sitting - x 20    Dry Needling (4min): One 100mm x .4mm needle placed into the quadriceps to help decrease quad pain and spasms. Patient was educated on risks of the therapy to be performed with dry needling. Patient states she felt increased soreness with exercise.    Modalities (20min): with patient in sitting One ice pack placed along the L anterior knee with patient positioned in sitting to decrease pain and spasms within the musculature attaching to the L knee. High volt placed along the superior and inferior R and L sides of the patella to decrease increased pain and spasms; four large pads on R and L superior and inferior aspects of the L patella. Patient educated on treatment.   Patient demonstrates  decreases pain after modalities    PT Education - 06/28/17 1814    Education provided  Yes    Education Details  form/technique with exercise    Person(s) Educated  Patient    Methods  Explanation;Demonstration    Comprehension  Verbalized understanding;Returned demonstration          PT Long Term Goals - 06/28/17 1746      PT LONG TERM GOAL #1   Title  Patient will be independent with HEP to continue benefits of therapy after discharge    Baseline  Heavy cues for Ther ex performance; 06/28/2017: requires cueing for     Time  6    Period  Weeks    Status  New      PT LONG TERM GOAL #2   Title  Patient will have a worst pain of a 3/10 in the past week to indicate improvement at rest with knee pain.     Baseline  10/10 worst pain; 06/28/2017: 7/10     Time  6    Period  Weeks    Status  On-going      PT LONG TERM GOAL #3   Title  Patient will improve LEFS score to 80/80 to indicate improvement with running without increase in pain.     Baseline  74/80 LEFS; 06/28/2017: 72/80    Time  6    Period  Weeks    Status  New      PT LONG TERM GOAL #4   Title  Patient will be able to workout at her exercise class without increase in pain with activities.    Baseline  Increase in pain; 06/28/2017: Increase in pain with performance    Time  6    Period  Weeks    Status  New            Plan - 06/28/17 1836    Clinical Impression Statement  Patient is making progress towards long term goals with greater knee mobility, improved strength, and decreased maximal pain scores. Patient demonstrates decreased pain with exercises after cueing to activate her lateral hip musculature indicating poor motor control and hip strength. Patient will benefit from further skilled therapy focused on improving limitations to return to prior level of function.     Rehab Potential  Fair    Clinical Impairments Affecting Rehab Potential  (+) highly motivation (-) 3 months of unrelenting pain    PT  Frequency  2x / week    PT Duration  6 weeks    PT Treatment/Interventions  Electrical Stimulation;Iontophoresis 4mg /ml Dexamethasone;Cryotherapy;Moist Heat;Ultrasound;Stair training;Gait training;Therapeutic activities;Therapeutic exercise;Balance training;Neuromuscular re-education;Manual techniques;Patient/family education;Passive range of motion;Dry needling  PT Next Visit Plan  Progress knee coordination and mobility    PT Home Exercise Plan  See education    Consulted and Agree with Plan of Care  Patient       Patient will benefit from skilled therapeutic intervention in order to improve the following deficits and impairments:  Abnormal gait, Decreased coordination, Decreased endurance, Decreased activity tolerance, Decreased range of motion, Decreased strength, Decreased balance, Difficulty walking, Hypomobility, Increased muscle spasms  Visit Diagnosis: Muscle weakness (generalized)  Acute pain of left knee     Problem List Patient Active Problem List   Diagnosis Date Noted  . Dysarthria 10/10/2014  . 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 LLQ pain 11/07/2013  . Carpal tunnel syndrome of right wrist 08/08/2013  . De Quervain's disease (radial styloid tenosynovitis) 05/10/2013  . Ganglion cyst of wrist 04/26/2013  . Chronic pelvic pain in female 05/23/2012  . History of sinus tachycardia 10/07/2011  . Migraine 10/07/2011  . Obesity, unspecified 10/07/2011  . Moderate obstructive sleep apnea 09/23/2011  . APL (antiphospholipid syndrome) (HCC) 11/05/2010  . Metabolic syndrome 11/04/2010  . Prediabetes 11/04/2010  . Prehypertension 11/04/2010  . Seasonal allergies 11/04/2010  . Vitamin D deficiency disease 11/04/2010  . Chronic asthma, mild persistent, uncomplicated 10/20/2010  . Polycystic ovarian syndrome 10/20/2010    Myrene Galas, PT DPT 06/28/2017, 6:43 PM  [place] PHYSICAL AND SPORTS MEDICINE [address] [place], [address] Phone: [phone]   Fax:  [phone]  Name: Amber Ochoa MRN: [id number] Date of Birth: Nov 07, 1980

## 2017-06-30 ENCOUNTER — Ambulatory Visit: Payer: Medicaid Other

## 2017-06-30 DIAGNOSIS — M6281 Muscle weakness (generalized): Secondary | ICD-10-CM

## 2017-06-30 DIAGNOSIS — M25562 Pain in left knee: Secondary | ICD-10-CM

## 2017-06-30 NOTE — Therapy (Signed)
[place] [facility] MEDICINE [address] [place], [address] Phone: [phone]   Fax:  [phone]  Physical Therapy Treatment  Patient Details  Name: Amber Ochoa MRN: [id number] Date of Birth: 10-Nov-1980 Referring Provider: Juanell Fairly MD   Encounter Date: 06/30/2017  PT End of Session - 06/30/17 1854    Visit Number  11    Number of Visits  13    Date for PT Re-Evaluation  07/26/17    Authorization Type  MEDICAID    PT Start Time  1830    PT Stop Time  1925    PT Time Calculation (min)  55 min    Activity Tolerance  Patient tolerated treatment well    Behavior During Therapy  [facility] for tasks assessed/performed       Past Medical History:  Diagnosis Date  . Asthma   . Blood clotting disorder (HCC)   . Carpal tunnel syndrome of right wrist 08/08/2013  . Chronic asthma, mild persistent, uncomplicated 10/20/2010  . Chronic pelvic pain in female 05/23/2012  . De Quervain's disease (radial styloid tenosynovitis) 05/10/2013  . Dysarthria 10/10/2014  . Endometriosis   . Ganglion cyst of wrist 04/26/2013  . Headache   . Heart murmur   . 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 Migraine 10/07/2011  . Moderate obstructive sleep apnea 09/23/2011   Overview:  June 15, 2014: SPLIT-NIGHT PSG @ [facility]: overall AHI 26.2/hr. The patient had CPAP applied. The maximal observed pressure was: 11 cm H2O. The recommended pressure is: 11-16 cm H2O.  CPAP set up on 08/28/14 by [facility]  . PCOS (polycystic ovarian syndrome)   . Polycystic ovarian syndrome 10/20/2010  . Seasonal allergies 11/04/2010  . Tachycardia   . Vitamin D deficiency disease 11/04/2010    Past Surgical History:  Procedure Laterality Date  . ABDOMINAL HYSTERECTOMY    . GALLBLADDER SURGERY    . Tendonitis Right     There were no vitals filed for this visit.  Subjective Assessment -  06/30/17 1848    Subjective  Patient reports increased pain and reports she continues to make modifications during burn boot camp.     Pertinent History  History of LBP, Dequarivians, PCOS, clotting disorder, asthma     Limitations  House hold activities    Diagnostic tests  MRI: Increased swelling over the knee    Patient Stated Goals  Decrease pain, return to workout without modifications    Currently in Pain?  Yes    Pain Score  6     Pain Location  Knee    Pain Orientation  Left    Pain Descriptors / Indicators  Aching    Pain Type  Acute pain    Pain Onset  More than a month ago    Pain Frequency  Constant       TREATMENT Therapeutic Exercise: Sitting with SLR with patient holding leg in air for 10sec  Standing squats with UE support - 2 x 20  Single leg heel raises - 2 x 10  Step ups onto 6" step - x20 with focus on improving    Manual Therapy: STM performed to quadriceps and gastrocnemius with patient positioned in long sitting to improve upon muscular guarding and pain.  Patella mobilizations with patient in long sitting - x 20 5sec afterwards     Modalities ( ): with patient  in sitting One ice pack placed along the L anterior knee with patient positioned in sitting to decrease pain and spasms within the musculature attaching to the L knee. High volt placed along the superior and inferior R and L sides of the patella to decrease increased pain and spasms; four large pads on R and L superior and inferior aspects of the L patella. Patient educated on treatment.    Patient demonstrates decreases pain after modalities   PT Education - 06/30/17 1854    Education provided  Yes    Education Details  form/technique with exercise    Person(s) Educated  Patient    Methods  Explanation;Demonstration    Comprehension  Verbalized understanding;Returned demonstration          PT Long Term Goals - 06/28/17 1746      PT LONG TERM GOAL #1   Title  Patient will be  independent with HEP to continue benefits of therapy after discharge    Baseline  Heavy cues for Ther ex performance; 06/28/2017: requires cueing for     Time  6    Period  Weeks    Status  New      PT LONG TERM GOAL #2   Title  Patient will have a worst pain of a 3/10 in the past week to indicate improvement at rest with knee pain.     Baseline  10/10 worst pain; 06/28/2017: 7/10     Time  6    Period  Weeks    Status  On-going      PT LONG TERM GOAL #3   Title  Patient will improve LEFS score to 80/80 to indicate improvement with running without increase in pain.     Baseline  74/80 LEFS; 06/28/2017: 72/80    Time  6    Period  Weeks    Status  New      PT LONG TERM GOAL #4   Title  Patient will be able to workout at her exercise class without increase in pain with activities.    Baseline  Increase in pain; 06/28/2017: Increase in pain with performance    Time  6    Period  Weeks    Status  New            Plan - 06/30/17 1905    Clinical Impression Statement  Focused on engaging hip musculature with performing standing step ups and squat movements. Patient continues to demosntrate increased pain with exercises indicating poor coordination and strength. Will continue to focus on improving limitations to return to prior level of function.     Rehab Potential  Fair    Clinical Impairments Affecting Rehab Potential  (+) highly motivation (-) 3 months of unrelenting pain    PT Frequency  2x / week    PT Duration  6 weeks    PT Treatment/Interventions  Electrical Stimulation;Iontophoresis 4mg /ml Dexamethasone;Cryotherapy;Moist Heat;Ultrasound;Stair training;Gait training;Therapeutic activities;Therapeutic exercise;Balance training;Neuromuscular re-education;Manual techniques;Patient/family education;Passive range of motion;Dry needling    PT Next Visit Plan  Progress knee coordination and mobility    PT Home Exercise Plan  See education    Consulted and Agree with Plan of Care  Patient        Patient will benefit from skilled therapeutic intervention in order to improve the following deficits and impairments:  Abnormal gait, Decreased coordination, Decreased endurance, Decreased activity tolerance, Decreased range of motion, Decreased strength, Decreased balance, Difficulty walking, Hypomobility, Increased muscle spasms  Visit Diagnosis: Muscle weakness (  generalized)  Acute pain of left knee     Problem List Patient Active Problem List   Diagnosis Date Noted  . Dysarthria 10/10/2014  . 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 LLQ pain 11/07/2013  . Carpal tunnel syndrome of right wrist 08/08/2013  . De Quervain's disease (radial styloid tenosynovitis) 05/10/2013  . Ganglion cyst of wrist 04/26/2013  . Chronic pelvic pain in female 05/23/2012  . History of sinus tachycardia 10/07/2011  . Migraine 10/07/2011  . Obesity, unspecified 10/07/2011  . Moderate obstructive sleep apnea 09/23/2011  . APL (antiphospholipid syndrome) (HCC) 11/05/2010  . Metabolic syndrome 11/04/2010  . Prediabetes 11/04/2010  . Prehypertension 11/04/2010  . Seasonal allergies 11/04/2010  . Vitamin D deficiency disease 11/04/2010  . Chronic asthma, mild persistent, uncomplicated 10/20/2010  . Polycystic ovarian syndrome 10/20/2010    Myrene GalasWesley Mozell Haber, PT DPT 06/30/2017, 7:21 PM  [place] [facility] [address] [place], [place], [address] Phone: [phone]   Fax:  [phone]  Name: Sarita HaverKrystal Star Postema MRN: [id number] Date of Birth: 11/25/1980

## 2017-07-04 ENCOUNTER — Ambulatory Visit: Payer: Medicaid Other

## 2017-07-04 DIAGNOSIS — M6281 Muscle weakness (generalized): Secondary | ICD-10-CM

## 2017-07-04 DIAGNOSIS — M25562 Pain in left knee: Secondary | ICD-10-CM

## 2017-07-04 NOTE — Therapy (Signed)
[place] [facility] [address] [place], [address] Phone: [phone]   Fax:  [phone]  Physical Therapy Treatment  Patient Details  Name: Amber Ochoa MRN: [id number] Date of Birth: [birth year] Referring Provider: Juanell Fairly MD   Encounter Date: 07/04/2017  PT End of Session - 07/04/17 1838    Visit Number  12    Number of Visits  21    Date for PT Re-Evaluation  07/26/17    Authorization Type  MEDICAID    PT Start Time  1830    PT Stop Time  1930    PT Time Calculation (min)  60 min    Activity Tolerance  Patient tolerated treatment well    Behavior During Therapy  [facility] for tasks assessed/performed       Past Medical History:  Diagnosis Date  . Asthma   . Blood clotting disorder (HCC)   . Carpal tunnel syndrome of right wrist 08/08/2013  . Chronic asthma, mild persistent, uncomplicated 10/20/2010  . Chronic pelvic pain in female 05/23/2012  . De Quervain's disease (radial styloid tenosynovitis) 05/10/2013  . Dysarthria 10/10/2014  . Endometriosis   . Ganglion cyst of wrist 04/26/2013  . Headache   . Heart murmur   . 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 Migraine 10/07/2011  . Moderate obstructive sleep apnea 09/23/2011   Overview:  June 15, 2014: SPLIT-NIGHT PSG @ [facility]: overall AHI 26.2/hr. The patient had CPAP applied. The maximal observed pressure was: 11 cm H2O. The recommended pressure is: 11-16 cm H2O.  CPAP set up on 08/28/14 by [facility]  . PCOS (polycystic ovarian syndrome)   . Polycystic ovarian syndrome 10/20/2010  . Seasonal allergies 11/04/2010  . Tachycardia   . Vitamin D deficiency disease 11/04/2010    Past Surgical History:  Procedure Laterality Date  . ABDOMINAL HYSTERECTOMY    . GALLBLADDER SURGERY    . Tendonitis Right     There were no vitals filed for this visit.  Subjective Assessment -  07/04/17 1834    Subjective  Patient reports her pain is around a 6/10 today since she had work today and did not wear brace.     Pertinent History  History of LBP, Dequarivians, PCOS, clotting disorder, asthma     Limitations  House hold activities    Diagnostic tests  MRI: Increased swelling over the knee    Patient Stated Goals  Decrease pain, return to workout without modifications    Currently in Pain?  Yes    Pain Score  6     Pain Location  Knee    Pain Orientation  Left    Pain Descriptors / Indicators  Aching    Pain Type  Acute pain    Pain Onset  More than a month ago    Pain Frequency  Constant       TREATMENT Therapeutic Exercise: Hip extension in prone - 2 x 10 B Hip abduction in sidelying - 2 x 20 B Semi-hooklying  with SLR with patient - 2 x 10  Bridges in hookyling - x20  Step ups onto 6" step - x20 with focus on improving, x20 onto 4" step      Modalities ( ): with patient in sitting One ice pack placed along the L anterior knee with patient positioned in sitting to decrease pain and spasms within the musculature  attaching to the L knee. High volt placed along the superior and inferior R and L sides of the patella to decrease increased pain and spasms; four large pads on R and L superior and inferior aspects of the L patella. Patient educated on treatment.    Patient demonstrates decreases pain after modalities   PT Education - 07/04/17 1837    Education provided  Yes    Education Details  form/technique with exercise    Person(s) Educated  Patient    Methods  Explanation;Demonstration    Comprehension  Verbalized understanding;Returned demonstration          PT Long Term Goals - 06/28/17 1746      PT LONG TERM GOAL #1   Title  Patient will be independent with HEP to continue benefits of therapy after discharge    Baseline  Heavy cues for Ther ex performance; 06/28/2017: requires cueing for     Time  6    Period  Weeks    Status  New      PT  LONG TERM GOAL #2   Title  Patient will have a worst pain of a 3/10 in the past week to indicate improvement at rest with knee pain.     Baseline  10/10 worst pain; 06/28/2017: 7/10     Time  6    Period  Weeks    Status  On-going      PT LONG TERM GOAL #3   Title  Patient will improve LEFS score to 80/80 to indicate improvement with running without increase in pain.     Baseline  74/80 LEFS; 06/28/2017: 72/80    Time  6    Period  Weeks    Status  New      PT LONG TERM GOAL #4   Title  Patient will be able to workout at her exercise class without increase in pain with activities.    Baseline  Increase in pain; 06/28/2017: Increase in pain with performance    Time  6    Period  Weeks    Status  New            Plan - 07/04/17 1849    Clinical Impression Statement  Continued to focus on performing hip activation and muscular coordination with exercises. Patient continues to have pain with performing stepping activities indicating poor muscular coordination and poor hip strength. Patient will benefit from further skilled therapy focused on improving limitations to return to prior level of function.     Rehab Potential  Fair    Clinical Impairments Affecting Rehab Potential  (+) highly motivation (-) 3 months of unrelenting pain    PT Frequency  2x / week    PT Duration  6 weeks    PT Treatment/Interventions  Electrical Stimulation;Iontophoresis 4mg /ml Dexamethasone;Cryotherapy;Moist Heat;Ultrasound;Stair training;Gait training;Therapeutic activities;Therapeutic exercise;Balance training;Neuromuscular re-education;Manual techniques;Patient/family education;Passive range of motion;Dry needling    PT Next Visit Plan  Progress knee coordination and mobility    PT Home Exercise Plan  See education    Consulted and Agree with Plan of Care  Patient       Patient will benefit from skilled therapeutic intervention in order to improve the following deficits and impairments:  Abnormal gait,  Decreased coordination, Decreased endurance, Decreased activity tolerance, Decreased range of motion, Decreased strength, Decreased balance, Difficulty walking, Hypomobility, Increased muscle spasms  Visit Diagnosis: Muscle weakness (generalized)  Acute pain of left knee     Problem List Patient Active Problem List  Diagnosis Date Noted  . Dysarthria 10/10/2014  . 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 LLQ pain 11/07/2013  . Carpal tunnel syndrome of right wrist 08/08/2013  . De Quervain's disease (radial styloid tenosynovitis) 05/10/2013  . Ganglion cyst of wrist 04/26/2013  . Chronic pelvic pain in female 05/23/2012  . History of sinus tachycardia 10/07/2011  . Migraine 10/07/2011  . Obesity, unspecified 10/07/2011  . Moderate obstructive sleep apnea 09/23/2011  . APL (antiphospholipid syndrome) (HCC) 11/05/2010  . Metabolic syndrome 11/04/2010  . Prediabetes 11/04/2010  . Prehypertension 11/04/2010  . Seasonal allergies 11/04/2010  . Vitamin D deficiency disease 11/04/2010  . Chronic asthma, mild persistent, uncomplicated 10/20/2010  . Polycystic ovarian syndrome 10/20/2010    Myrene GalasWesley Daxen Lanum, PT DPT 07/04/2017, 7:15 PM  [place] [facility] [address] [place], [place], [address] Phone: [phone]   Fax:  [phone]  Name: Sarita HaverKrystal Star Ochoa MRN: [id number] Date of Birth: 11/19/1980

## 2017-07-07 ENCOUNTER — Ambulatory Visit: Payer: Medicaid Other

## 2017-07-07 DIAGNOSIS — M6281 Muscle weakness (generalized): Secondary | ICD-10-CM | POA: Diagnosis not present

## 2017-07-07 DIAGNOSIS — M25562 Pain in left knee: Secondary | ICD-10-CM

## 2017-07-07 NOTE — Therapy (Signed)
[place] [facility]ORTS [facility]ICINE [address] [place], [address] Phone: [phone]   Fax:  [phone]  Physical Therapy Treatment  Patient Details  Name: Amber Ochoa MRN: [id number] Date of Birth: 1980/04/01 Referring Provider: Juanell Fairly MD   Encounter Date: 07/07/2017  PT End of Session - 07/07/17 1842    Visit Number  13    Number of Visits  21    Date for PT Re-Evaluation  07/26/17    Authorization Type  MEDICAID    PT Start Time  1820    PT Stop Time  1920    PT Time Calculation (min)  60 min    Activity Tolerance  Patient tolerated treatment well    Behavior During Therapy  [facility] for tasks assessed/performed       Past Medical History:  Diagnosis Date  . Asthma   . Blood clotting disorder (HCC)   . Carpal tunnel syndrome of right wrist 08/08/2013  . Chronic asthma, mild persistent, uncomplicated 10/20/2010  . Chronic pelvic pain in female 05/23/2012  . De Quervain's disease (radial styloid tenosynovitis) 05/10/2013  . Dysarthria 10/10/2014  . Endometriosis   . Ganglion cyst of wrist 04/26/2013  . Headache   . Heart murmur   . 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 Migraine 10/07/2011  . Moderate obstructive sleep apnea 09/23/2011   Overview:  June 15, 2014: SPLIT-NIGHT PSG @ [facility]: overall AHI 26.2/hr. The patient had CPAP applied. The maximal observed pressure was: 11 cm H2O. The recommended pressure is: 11-16 cm H2O.  CPAP set up on 08/28/14 by [facility]  . PCOS (polycystic ovarian syndrome)   . Polycystic ovarian syndrome 10/20/2010  . Seasonal allergies 11/04/2010  . Tachycardia   . Vitamin D deficiency disease 11/04/2010    Past Surgical History:  Procedure Laterality Date  . ABDOMINAL HYSTERECTOMY    . GALLBLADDER SURGERY    . Tendonitis Right     There were no vitals filed for this visit.  Subjective Assessment -  07/07/17 1827    Subjective  Patient reports she went to leg day today and states she wore the brace. Patient states she did not work today.     Pertinent History  History of LBP, Dequarivians, PCOS, clotting disorder, asthma     Limitations  House hold activities    Diagnostic tests  MRI: Increased swelling over the knee    Patient Stated Goals  Decrease pain, return to workout without modifications    Currently in Pain?  Yes    Pain Score  6     Pain Location  Knee    Pain Orientation  Left    Pain Descriptors / Indicators  Aching    Pain Type  Acute pain    Pain Onset  More than a month ago    Pain Frequency  Constant        TREATMENT Therapeutic Exercise: Step ups onto 6" step with airex pad on step - x 20  Hip sliders into hip abduction/extension - x 20  Single leg on airex ball toss - 2 x 20 B Single leg on Bosu ball - 2 x 45sec Straight leg dead lifts in standing - x 15    Modalities ( ): with patient in sitting One ice pack placed along the L anterior knee with patient positioned in sitting to decrease pain and spasms  within the musculature attaching to the L knee. High volt placed along the superior and inferior R and L sides of the patella to decrease increased pain and spasms; four large pads on R and L superior and inferior aspects of the L patella. Patient educated on treatment.    Patient demonstrates decreases pain after modalities    PT Education - 07/07/17 1842    Education provided  Yes    Education Details  form/technique with exercise    Person(s) Educated  Patient    Methods  Explanation;Demonstration    Comprehension  Verbalized understanding;Returned demonstration          PT Long Term Goals - 06/28/17 1746      PT LONG TERM GOAL #1   Title  Patient will be independent with HEP to continue benefits of therapy after discharge    Baseline  Heavy cues for Ther ex performance; 06/28/2017: requires cueing for     Time  6    Period  Weeks    Status   New      PT LONG TERM GOAL #2   Title  Patient will have a worst pain of a 3/10 in the past week to indicate improvement at rest with knee pain.     Baseline  10/10 worst pain; 06/28/2017: 7/10     Time  6    Period  Weeks    Status  On-going      PT LONG TERM GOAL #3   Title  Patient will improve LEFS score to 80/80 to indicate improvement with running without increase in pain.     Baseline  74/80 LEFS; 06/28/2017: 72/80    Time  6    Period  Weeks    Status  New      PT LONG TERM GOAL #4   Title  Patient will be able to workout at her exercise class without increase in pain with activities.    Baseline  Increase in pain; 06/28/2017: Increase in pain with performance    Time  6    Period  Weeks    Status  New            Plan - 07/07/17 1906    Clinical Impression Statement  Patient demonstrates improvement with step up exercises today with less pain with performance. Patient continues to have pain at rest but reports the pain is improving. Patient will benefit from further skilled therapy to return to prior level of function.     Rehab Potential  Fair    Clinical Impairments Affecting Rehab Potential  (+) highly motivation (-) 3 months of unrelenting pain    PT Frequency  2x / week    PT Duration  6 weeks    PT Treatment/Interventions  Electrical Stimulation;Iontophoresis 4mg /ml Dexamethasone;Cryotherapy;Moist Heat;Ultrasound;Stair training;Gait training;Therapeutic activities;Therapeutic exercise;Balance training;Neuromuscular re-education;Manual techniques;Patient/family education;Passive range of motion;Dry needling    PT Next Visit Plan  Progress knee coordination and mobility    PT Home Exercise Plan  See education    Consulted and Agree with Plan of Care  Patient       Patient will benefit from skilled therapeutic intervention in order to improve the following deficits and impairments:  Abnormal gait, Decreased coordination, Decreased endurance, Decreased activity  tolerance, Decreased range of motion, Decreased strength, Decreased balance, Difficulty walking, Hypomobility, Increased muscle spasms  Visit Diagnosis: Muscle weakness (generalized)  Acute pain of left knee     Problem List Patient Active Problem List   Diagnosis  Date Noted  . Dysarthria 10/10/2014  . 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 LLQ pain 11/07/2013  . Carpal tunnel syndrome of right wrist 08/08/2013  . De Quervain's disease (radial styloid tenosynovitis) 05/10/2013  . Ganglion cyst of wrist 04/26/2013  . Chronic pelvic pain in female 05/23/2012  . History of sinus tachycardia 10/07/2011  . Migraine 10/07/2011  . Obesity, unspecified 10/07/2011  . Moderate obstructive sleep apnea 09/23/2011  . APL (antiphospholipid syndrome) (HCC) 11/05/2010  . Metabolic syndrome 11/04/2010  . Prediabetes 11/04/2010  . Prehypertension 11/04/2010  . Seasonal allergies 11/04/2010  . Vitamin D deficiency disease 11/04/2010  . Chronic asthma, mild persistent, uncomplicated 10/20/2010  . Polycystic ovarian syndrome 10/20/2010    Myrene GalasWesley Solenne Manwarren, PT DPT 07/07/2017, 7:18 PM  [place] [facility] [address] [place], [place], [address] Phone: [phone]   Fax:  [phone]  Name: Amber Ochoa MRN: [id number] Date of Birth: 06/12/1980

## 2017-07-12 ENCOUNTER — Ambulatory Visit: Payer: Medicaid Other

## 2017-07-12 DIAGNOSIS — M6281 Muscle weakness (generalized): Secondary | ICD-10-CM

## 2017-07-12 DIAGNOSIS — M25562 Pain in left knee: Secondary | ICD-10-CM

## 2017-07-12 NOTE — Therapy (Signed)
[facility] [address], [place], [address] Phone: [phone]   Fax:  [phone]  Physical Therapy Treatment  Patient Details  Name: Amber Ochoa MRN: [id number] Date of Birth: Nov 22, 1980 Referring Provider: Juanell Fairly MD   Encounter Date: 07/12/2017  PT End of Session - 07/12/17 1855    Visit Number  14    Number of Visits  21    Date for PT Re-Evaluation  07/26/17    Authorization Type  MEDICAID    PT Start Time  1825    PT Stop Time  1920    PT Time Calculation (min)  55 min    Activity Tolerance  Patient tolerated treatment well    Behavior During Therapy  [facility] for tasks assessed/performed       Past Medical History:  Diagnosis Date  . Asthma   . Blood clotting disorder (HCC)   . Carpal tunnel syndrome of right wrist 08/08/2013  . Chronic asthma, mild persistent, uncomplicated 10/20/2010  . Chronic pelvic pain in female 05/23/2012  . De Quervain's disease (radial styloid tenosynovitis) 05/10/2013  . Dysarthria 10/10/2014  . Endometriosis   . Ganglion cyst of wrist 04/26/2013  . Headache   . Heart murmur   . 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 Migraine 10/07/2011  . Moderate obstructive sleep apnea 09/23/2011   Overview:  June 15, 2014: SPLIT-NIGHT PSG @ [facility]: overall AHI 26.2/hr. The patient had CPAP applied. The maximal observed pressure was: 11 cm H2O. The recommended pressure is: 11-16 cm H2O.  CPAP set up on 08/28/14 by [facility]  . PCOS (polycystic ovarian syndrome)   . Polycystic ovarian syndrome 10/20/2010  . Seasonal allergies 11/04/2010  . Tachycardia   . Vitamin D deficiency disease 11/04/2010    Past Surgical History:  Procedure Laterality Date  . ABDOMINAL HYSTERECTOMY    . GALLBLADDER SURGERY    . Tendonitis Right     There were no vitals filed for this visit.  Subjective Assessment -  07/12/17 1847    Subjective  Patient reports she continues to have leg pain and reports her knee is feeling stiff today.    Pertinent History  History of LBP, Dequarivians, PCOS, clotting disorder, asthma     Limitations  House hold activities    Diagnostic tests  MRI: Increased swelling over the knee    Patient Stated Goals  Decrease pain, return to workout without modifications    Currently in Pain?  Yes    Pain Score  5     Pain Location  Knee    Pain Orientation  Left    Pain Descriptors / Indicators  Aching    Pain Type  Acute pain    Pain Onset  More than a month ago    Pain Frequency  Constant       TREATMENT Therapeutic Exercise: SLR in long sitting - 2 x 10 with leg straight Hip abduction at hip machine - x20 B at 40# Step ups onto 6" step - 2 x10 with focus on improving hip abduction   Manual Therapy: STM performed to quadriceps and gastrocnemius with patient positioned in long sitting to improve upon muscular guarding and pain.  Patella mobilizations with patient in long sitting - x 20 5sec afterwards    Modalities ( ): with patient in sitting One ice pack placed along the L  anterior knee with patient positioned in sitting to decrease pain and spasms within the musculature attaching to the L knee. High volt placed along the superior and inferior R and L sides of the patella to decrease increased pain and spasms; four large pads on R and L superior and inferior aspects of the L patella. Patient educated on treatment.    Patient demonstrates decreases pain after modalities       PT Education - 07/12/17 1852    Education provided  Yes    Education Details  form/technique with exercise    Person(s) Educated  Patient    Methods  Explanation;Demonstration    Comprehension  Verbalized understanding;Returned demonstration          PT Long Term Goals - 06/28/17 1746      PT LONG TERM GOAL #1   Title  Patient will be independent with HEP to continue benefits of  therapy after discharge    Baseline  Heavy cues for Ther ex performance; 06/28/2017: requires cueing for     Time  6    Period  Weeks    Status  New      PT LONG TERM GOAL #2   Title  Patient will have a worst pain of a 3/10 in the past week to indicate improvement at rest with knee pain.     Baseline  10/10 worst pain; 06/28/2017: 7/10     Time  6    Period  Weeks    Status  On-going      PT LONG TERM GOAL #3   Title  Patient will improve LEFS score to 80/80 to indicate improvement with running without increase in pain.     Baseline  74/80 LEFS; 06/28/2017: 72/80    Time  6    Period  Weeks    Status  New      PT LONG TERM GOAL #4   Title  Patient will be able to workout at her exercise class without increase in pain with activities.    Baseline  Increase in pain; 06/28/2017: Increase in pain with performance    Time  6    Period  Weeks    Status  New            Plan - 07/12/17 1858    Clinical Impression Statement  Patient demonstrates poor hip and quadriceps strength with performing standing exercises today. Patient continues to feel alleviation of symptoms with performing modalites such as high volt e-stim. Patient continues to demonstrate limited improvement with therapy in terms of subjective pain, but is able to bend her knee into greater AROM and perform higher level of exercise. Patient will benefit from further skilled therapy focused on improving limitations to return to prior level of function.     Rehab Potential  Fair    Clinical Impairments Affecting Rehab Potential  (+) highly motivation (-) 3 months of unrelenting pain    PT Frequency  2x / week    PT Duration  6 weeks    PT Treatment/Interventions  Electrical Stimulation;Iontophoresis 4mg /ml Dexamethasone;Cryotherapy;Moist Heat;Ultrasound;Stair training;Gait training;Therapeutic activities;Therapeutic exercise;Balance training;Neuromuscular re-education;Manual techniques;Patient/family education;Passive range of  motion;Dry needling    PT Next Visit Plan  Progress knee coordination and mobility    PT Home Exercise Plan  See education    Consulted and Agree with Plan of Care  Patient       Patient will benefit from skilled therapeutic intervention in order to improve the following deficits and impairments:  Abnormal gait, Decreased coordination, Decreased endurance, Decreased activity tolerance, Decreased range of motion, Decreased strength, Decreased balance, Difficulty walking, Hypomobility, Increased muscle spasms  Visit Diagnosis: Acute pain of left knee  Muscle weakness (generalized)     Problem List Patient Active Problem List   Diagnosis Date Noted  . Dysarthria 10/10/2014  . 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 LLQ pain 11/07/2013  . Carpal tunnel syndrome of right wrist 08/08/2013  . De Quervain's disease (radial styloid tenosynovitis) 05/10/2013  . Ganglion cyst of wrist 04/26/2013  . Chronic pelvic pain in female 05/23/2012  . History of sinus tachycardia 10/07/2011  . Migraine 10/07/2011  . Obesity, unspecified 10/07/2011  . Moderate obstructive sleep apnea 09/23/2011  . APL (antiphospholipid syndrome) (HCC) 11/05/2010  . Metabolic syndrome 11/04/2010  . Prediabetes 11/04/2010  . Prehypertension 11/04/2010  . Seasonal allergies 11/04/2010  . Vitamin D deficiency disease 11/04/2010  . Chronic asthma, mild persistent, uncomplicated 10/20/2010  . Polycystic ovarian syndrome 10/20/2010    Myrene Galas, PT DPT 07/12/2017, 7:15 PM  [place] [facility] PHYSICAL AND SPORTS MEDICINE [address] [place], [address] Phone: [phone]   Fax:  [phone]  Name: Amber Ochoa MRN: [id number] Date of Birth: 03-19-1981

## 2017-07-14 ENCOUNTER — Ambulatory Visit: Payer: Medicaid Other

## 2017-07-14 DIAGNOSIS — M6281 Muscle weakness (generalized): Secondary | ICD-10-CM | POA: Diagnosis not present

## 2017-07-14 DIAGNOSIS — M25562 Pain in left knee: Secondary | ICD-10-CM

## 2017-07-14 NOTE — Therapy (Signed)
[place] [facility] [address] [place], [address] Phone: [phone]   Fax:  [phone]  Physical Therapy Treatment  Patient Details  Name: Amber Ochoa MRN: [id number] Date of Birth: Dec 22, 1980 Referring Provider: Juanell Fairly MD   Encounter Date: 07/14/2017  PT End of Session - 07/14/17 1907    Visit Number  15    Number of Visits  21    Date for PT Re-Evaluation  07/26/17    Authorization Type  MEDICAID    PT Start Time  1835    PT Stop Time  1923    PT Time Calculation (min)  48 min    Activity Tolerance  Patient tolerated treatment well    Behavior During Therapy  [facility] for tasks assessed/performed       Past Medical History:  Diagnosis Date  . Asthma   . Blood clotting disorder (HCC)   . Carpal tunnel syndrome of right wrist 08/08/2013  . Chronic asthma, mild persistent, uncomplicated 10/20/2010  . Chronic pelvic pain in female 05/23/2012  . De Quervain's disease (radial styloid tenosynovitis) 05/10/2013  . Dysarthria 10/10/2014  . Endometriosis   . Ganglion cyst of wrist 04/26/2013  . Headache   . Heart murmur   . 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 Migraine 10/07/2011  . Moderate obstructive sleep apnea 09/23/2011   Overview:  June 15, 2014: SPLIT-NIGHT PSG @ [facility]: overall AHI 26.2/hr. The patient had CPAP applied. The maximal observed pressure was: 11 cm H2O. The recommended pressure is: 11-16 cm H2O.  CPAP set up on 08/28/14 by [facility]  . PCOS (polycystic ovarian syndrome)   . Polycystic ovarian syndrome 10/20/2010  . Seasonal allergies 11/04/2010  . Tachycardia   . Vitamin D deficiency disease 11/04/2010    Past Surgical History:  Procedure Laterality Date  . ABDOMINAL HYSTERECTOMY    . GALLBLADDER SURGERY    . Tendonitis Right     There were no vitals filed for this visit.  Subjective Assessment -  07/14/17 1905    Subjective  Patient reports she continues to have increased knee pain and states she performed greater amount of leg resistance exercises the past 2 days.    Pertinent History  History of LBP, Dequarivians, PCOS, clotting disorder, asthma     Limitations  House hold activities    Diagnostic tests  MRI: Increased swelling over the knee    Patient Stated Goals  Decrease pain, return to workout without modifications    Currently in Pain?  Yes    Pain Score  5     Pain Location  Knee    Pain Orientation  Left    Pain Descriptors / Indicators  Aching    Pain Type  Acute pain    Pain Onset  More than a month ago    Pain Frequency  Constant        Manual Therapy: STM performed to quadriceps, hamstrings, popliteus and gastrocnemius with patient positioned in sitting to improve upon muscular guarding and pain.  Patella mobilizations with patient in long sitting - x 20 5sec every direction    Modalities ( ): with patient in sitting One ice pack placed along the L anterior knee with patient positioned in sitting to decrease pain and spasms within the musculature attaching to the L knee. High volt placed along the superior and inferior R and  L sides of the patella to decrease increased pain and spasms; four large pads on R and L superior and inferior aspects of the L patella. Patient educated on treatment.    Patient demonstrates decreased pain after modalities    PT Education - 07/14/17 1907    Education provided  Yes    Education Details  form/technique with exercise    Person(s) Educated  Patient    Methods  Explanation;Demonstration    Comprehension  Verbalized understanding;Returned demonstration          PT Long Term Goals - 06/28/17 1746      PT LONG TERM GOAL #1   Title  Patient will be independent with HEP to continue benefits of therapy after discharge    Baseline  Heavy cues for Ther ex performance; 06/28/2017: requires cueing for     Time  6    Period   Weeks    Status  New      PT LONG TERM GOAL #2   Title  Patient will have a worst pain of a 3/10 in the past week to indicate improvement at rest with knee pain.     Baseline  10/10 worst pain; 06/28/2017: 7/10     Time  6    Period  Weeks    Status  On-going      PT LONG TERM GOAL #3   Title  Patient will improve LEFS score to 80/80 to indicate improvement with running without increase in pain.     Baseline  74/80 LEFS; 06/28/2017: 72/80    Time  6    Period  Weeks    Status  New      PT LONG TERM GOAL #4   Title  Patient will be able to workout at her exercise class without increase in pain with activities.    Baseline  Increase in pain; 06/28/2017: Increase in pain with performance    Time  6    Period  Weeks    Status  New            Plan - 07/14/17 1910    Clinical Impression Statement  Focused on improving muscular guarding and spasms today during therapy by performing manual therapy. Patient demosntrates improvement in muscular guarding with exercise but demonstrates continued increased pain. Patient will benefit from further skilled therapy to return to prior level of function.     Rehab Potential  Fair    Clinical Impairments Affecting Rehab Potential  (+) highly motivation (-) 3 months of unrelenting pain    PT Frequency  2x / week    PT Duration  6 weeks    PT Treatment/Interventions  Electrical Stimulation;Iontophoresis 4mg /ml Dexamethasone;Cryotherapy;Moist Heat;Ultrasound;Stair training;Gait training;Therapeutic activities;Therapeutic exercise;Balance training;Neuromuscular re-education;Manual techniques;Patient/family education;Passive range of motion;Dry needling    PT Next Visit Plan  Progress knee coordination and mobility    PT Home Exercise Plan  See education    Consulted and Agree with Plan of Care  Patient       Patient will benefit from skilled therapeutic intervention in order to improve the following deficits and impairments:  Abnormal gait, Decreased  coordination, Decreased endurance, Decreased activity tolerance, Decreased range of motion, Decreased strength, Decreased balance, Difficulty walking, Hypomobility, Increased muscle spasms  Visit Diagnosis: Acute pain of left knee  Muscle weakness (generalized)     Problem List Patient Active Problem List   Diagnosis Date Noted  . Dysarthria 10/10/2014  . 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  LLQ pain 11/07/2013  . Carpal tunnel syndrome of right wrist 08/08/2013  . De Quervain's disease (radial styloid tenosynovitis) 05/10/2013  . Ganglion cyst of wrist 04/26/2013  . Chronic pelvic pain in female 05/23/2012  . History of sinus tachycardia 10/07/2011  . Migraine 10/07/2011  . Obesity, unspecified 10/07/2011  . Moderate obstructive sleep apnea 09/23/2011  . APL (antiphospholipid syndrome) (HCC) 11/05/2010  . Metabolic syndrome 11/04/2010  . Prediabetes 11/04/2010  . Prehypertension 11/04/2010  . Seasonal allergies 11/04/2010  . Vitamin D deficiency disease 11/04/2010  . Chronic asthma, mild persistent, uncomplicated 10/20/2010  . Polycystic ovarian syndrome 10/20/2010    Myrene Galas, PT DPT 07/14/2017, 7:16 PM  [place] [facility] PHYSICAL AND SPORTS MEDICINE [address] [place], [address] Phone: [phone]   Fax:  [phone]  Name: Amber Ochoa MRN: [id number] Date of Birth: Aug 14, 1980

## 2017-07-20 ENCOUNTER — Ambulatory Visit: Payer: Medicaid Other

## 2017-07-20 DIAGNOSIS — M25562 Pain in left knee: Secondary | ICD-10-CM

## 2017-07-20 DIAGNOSIS — M6281 Muscle weakness (generalized): Secondary | ICD-10-CM

## 2017-07-20 NOTE — Addendum Note (Signed)
Addended by: Bethanie DickerISSELL, [place] V on: 07/20/2017 03:51 PM   Modules accepted: Orders

## 2017-07-20 NOTE — Therapy (Addendum)
[place] [facility] [address], [place], [address] Phone: [phone]   Fax:  [phone]  Physical Therapy Treatment Physical Therapy Progress Note   Dates of reporting period  06/28/2017   to   07/20/2017  Patient Details  Name: Amber Ochoa MRN: [id number] Date of Birth: [birth year] Referring Provider: Juanell Fairly MD   Encounter Date: 07/20/2017  PT End of Session - 07/20/17 1213    Visit Number  16    Number of Visits  21    Date for PT Re-Evaluation  07/26/17    Authorization Type  MEDICAID    PT Start Time  1130    PT Stop Time  1222    PT Time Calculation (min)  52 min    Activity Tolerance  Patient tolerated treatment well    Behavior During Therapy  [facility] for tasks assessed/performed       Past Medical History:  Diagnosis Date  . Asthma   . Blood clotting disorder (HCC)   . Carpal tunnel syndrome of right wrist 08/08/2013  . Chronic asthma, mild persistent, uncomplicated 10/20/2010  . Chronic pelvic pain in female 05/23/2012  . De Quervain's disease (radial styloid tenosynovitis) 05/10/2013  . Dysarthria 10/10/2014  . Endometriosis   . Ganglion cyst of wrist 04/26/2013  . Headache   . Heart murmur   . 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 Migraine 10/07/2011  . Moderate obstructive sleep apnea 09/23/2011   Overview:  June 15, 2014: SPLIT-NIGHT PSG @ [facility]: overall AHI 26.2/hr. The patient had CPAP applied. The maximal observed pressure was: 11 cm H2O. The recommended pressure is: 11-16 cm H2O.  CPAP set up on 08/28/14 by [facility]  . PCOS (polycystic ovarian syndrome)   . Polycystic ovarian syndrome 10/20/2010  . Seasonal allergies 11/04/2010  . Tachycardia   . Vitamin D deficiency disease 11/04/2010    Past Surgical History:  Procedure Laterality Date  . ABDOMINAL HYSTERECTOMY    . GALLBLADDER SURGERY    .  Tendonitis Right     There were no vitals filed for this visit.  Subjective Assessment - 07/20/17 1210    Subjective  Patient reports she feels therapy is helping and states she is able to perform a greater amount of recreational activities with less pain. Patient states she would like to continue therapy to further improve symtpoms.     Pertinent History  History of LBP, Dequarivians, PCOS, clotting disorder, asthma     Limitations  House hold activities    Diagnostic tests  MRI: Increased swelling over the knee    Patient Stated Goals  Decrease pain, return to workout without modifications    Currently in Pain?  Yes    Pain Score  5     Pain Location  Knee    Pain Orientation  Left    Pain Descriptors / Indicators  Aching    Pain Type  Acute pain    Pain Onset  More than a month ago    Pain Frequency  Constant       TREATMENT  Manual Therapy: STM performed to quadriceps with patient positioned in long sitting to improve upon muscular guarding and pain. Active Release Techniques performed to patient's quad in sitting held for 2 x 30sec to promote improvement with muscle guarding and pain Patella mobilizations with patient in long sitting - x  20    Dry Needling ( ): Three 60mm x .4mm needle placed into the quadriceps to help decrease quad pain and spasms. Patient was educated on risks of the therapy to be performed with dry needling and verbally consents to performance of therapy.  Therapeutic Exercise: Standing knee to chest dynamic movement - x 10 performed B Standing knee flexion behind with 1 sec holds at end range - x 10 B Marching soldiers in standing dynamic movement - x 15 performed B Standing hip flexor stretch in standing holding for 1 sec each position - x 15     Modalities ( ): with patient in sitting One ice pack placed along the L anterior knee with patient positioned in sitting to decrease pain and spasms within the musculature attaching to the L knee.  High volt placed along the superior and inferior R and L sides of the patella to decrease increased pain and spasms; four large pads on R and L superior and inferior aspects of the L patella. Patient educated on treatment.    Patient demonstrates decreased pain after modalities   PT Education - 07/20/17 1213    Education provided  Yes    Education Details  form/technique with exercise    Person(s) Educated  Patient    Methods  Explanation;Demonstration    Comprehension  Verbalized understanding;Returned demonstration          PT Long Term Goals - 07/20/17 1347      PT LONG TERM GOAL #1   Title  Patient will be independent with HEP to continue benefits of therapy after discharge    Baseline  Heavy cues for Ther ex performance; 06/28/2017: requires cueing for exercise; 07/20/2017: Requires cueing for exercise mod    Time  6    Period  Weeks    Status  On-going      PT LONG TERM GOAL #2   Title  Patient will have a worst pain of a 3/10 in the past week to indicate improvement at rest with knee pain.     Baseline  10/10 worst pain; 06/28/2017: 7/10; 07/20/2017: 6/10    Time  6    Period  Weeks    Status  On-going      PT LONG TERM GOAL #3   Title  Patient will improve LEFS score to 80/80 to indicate improvement with running without increase in pain.     Baseline  74/80 LEFS; 06/28/2017: 72/80; 07/20/2017: 76/80    Time  6    Period  Weeks    Status  On-going      PT LONG TERM GOAL #4   Title  Patient will be able to workout at her exercise class without increase in pain with activities.    Baseline  Increase in pain; 06/28/2017: Increase in pain with performance    Time  6    Period  Weeks    Status  New            Plan - 07/20/17 1215    Clinical Impression Statement  Patient is making progress towards long term goals with improved worst pain compared to the beginning of therapy indicating significant improvement. Although patient is improving, she continues to have  difficulty and increased pain with funcitonal activities such as squatting, prolonged standing/walking, and performing running and step ups. Patient also demonstrates improvement in LEFS compared to iniitial treatment. Patient will benefit from further skilled therapy to return to prior level of function.     Rehab  Potential  Fair    Clinical Impairments Affecting Rehab Potential  (+) highly motivation (-) 3 months of unrelenting pain    PT Frequency  2x / week    PT Duration  6 weeks    PT Treatment/Interventions  Electrical Stimulation;Iontophoresis 4mg /ml Dexamethasone;Cryotherapy;Moist Heat;Ultrasound;Stair training;Gait training;Therapeutic activities;Therapeutic exercise;Balance training;Neuromuscular re-education;Manual techniques;Patient/family education;Passive range of motion;Dry needling    PT Next Visit Plan  Progress knee coordination and mobility    PT Home Exercise Plan  See education    Consulted and Agree with Plan of Care  Patient       Patient will benefit from skilled therapeutic intervention in order to improve the following deficits and impairments:  Abnormal gait, Decreased coordination, Decreased endurance, Decreased activity tolerance, Decreased range of motion, Decreased strength, Decreased balance, Difficulty walking, Hypomobility, Increased muscle spasms  Visit Diagnosis: Muscle weakness (generalized)  Acute pain of left knee     Problem List Patient Active Problem List   Diagnosis Date Noted  . Dysarthria 10/10/2014  . 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 LLQ pain 11/07/2013  . Carpal tunnel syndrome of right wrist 08/08/2013  . De Quervain's disease (radial styloid tenosynovitis) 05/10/2013  . Ganglion cyst of wrist 04/26/2013  . Chronic pelvic pain in female 05/23/2012  . History of sinus tachycardia 10/07/2011  . Migraine 10/07/2011  . Obesity, unspecified 10/07/2011  . Moderate obstructive sleep apnea 09/23/2011  . APL  (antiphospholipid syndrome) (HCC) 11/05/2010  . Metabolic syndrome 11/04/2010  . Prediabetes 11/04/2010  . Prehypertension 11/04/2010  . Seasonal allergies 11/04/2010  . Vitamin D deficiency disease 11/04/2010  . Chronic asthma, mild persistent, uncomplicated 10/20/2010  . Polycystic ovarian syndrome 10/20/2010    Myrene GalasWesley Zymier Rodgers, PT DPT 07/20/2017, 3:46 PM  [place] [facility] [address] [place], [place], [id number] Phone: [phone]   Fax:  [phone]  Name: Amber Ochoa MRN: [id number] Date of Birth: 04/25/1980

## 2017-08-02 ENCOUNTER — Ambulatory Visit: Payer: Medicaid Other | Attending: Orthopedic Surgery

## 2017-08-02 DIAGNOSIS — M25562 Pain in left knee: Secondary | ICD-10-CM | POA: Insufficient documentation

## 2017-08-02 DIAGNOSIS — M6281 Muscle weakness (generalized): Secondary | ICD-10-CM

## 2017-08-02 NOTE — Therapy (Signed)
[place] [facility] [address], [place], [address] Phone: [phone]   Fax:  [phone]  Physical Therapy Treatment  Patient Details  Name: Maricela Schreur MRN: [id number] Date of Birth: 10-31-1980 Referring Provider: Juanell Fairly MD   Encounter Date: 08/02/2017  PT End of Session - 08/02/17 1944    Visit Number  17    Number of Visits  21    Date for PT Re-Evaluation  08/17/17    Authorization Type  MEDICAID    PT Start Time  1850    PT Stop Time  1945    PT Time Calculation (min)  55 min    Activity Tolerance  Patient tolerated treatment well    Behavior During Therapy  [facility] for tasks assessed/performed       Past Medical History:  Diagnosis Date  . Asthma   . Blood clotting disorder (HCC)   . Carpal tunnel syndrome of right wrist 08/08/2013  . Chronic asthma, mild persistent, uncomplicated 10/20/2010  . Chronic pelvic pain in female 05/23/2012  . De Quervain's disease (radial styloid tenosynovitis) 05/10/2013  . Dysarthria 10/10/2014  . Endometriosis   . Ganglion cyst of wrist 04/26/2013  . Headache   . Heart murmur   . 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 Migraine 10/07/2011  . Moderate obstructive sleep apnea 09/23/2011   Overview:  June 15, 2014: SPLIT-NIGHT PSG @ [facility]: overall AHI 26.2/hr. The patient had CPAP applied. The maximal observed pressure was: 11 cm H2O. The recommended pressure is: 11-16 cm H2O.  CPAP set up on 08/28/14 by [facility]  . PCOS (polycystic ovarian syndrome)   . Polycystic ovarian syndrome 10/20/2010  . Seasonal allergies 11/04/2010  . Tachycardia   . Vitamin D deficiency disease 11/04/2010    Past Surgical History:  Procedure Laterality Date  . ABDOMINAL HYSTERECTOMY    . GALLBLADDER SURGERY    . Tendonitis Right     There were no vitals filed for this visit.  Subjective Assessment -  08/02/17 1941    Subjective  Patient reports she went to [place] 2 weeks agao and reports her lower leg and knee have been hurting more ever since. Patient states she had a cortizone injection which helped for 3 days but no longer feels the effects.     Pertinent History  History of LBP, Dequarivians, PCOS, clotting disorder, asthma     Limitations  House hold activities    Diagnostic tests  MRI: Increased swelling over the knee    Patient Stated Goals  Decrease pain, return to workout without modifications    Currently in Pain?  Yes    Pain Score  5     Pain Location  Knee    Pain Orientation  Left    Pain Descriptors / Indicators  Aching    Pain Type  Acute pain    Pain Onset  More than a month ago    Pain Frequency  Constant          TREATMENT   Manual Therapy: STM performed to quadriceps, tibialis anterior, plantar flexors, and peroneal with patient positioned in long sitting to improve upon muscular guarding and pain. Active Release Techniques performed to patient's quad in sitting held for 2 x 30sec to promote improvement with muscle guarding and pain   Therapeutic Exercise: Plantarflexion in sitting with RTB - x 20  Ankle eversion in sitting with RTB - x 20  Dorsiflexion in sitting with RTB - x20    Modalities ( ): with patient in long sitting One ice pack placed along the L anterior knee, one large ice pack along lower leg with patient positioned in sitting to decrease pain and spasms within the musculature attaching to the L knee. High volt placed along the superior and inferior R and L sides of the patella to decrease increased pain and spasms; two large pads on the superior and inferior aspects of the L patella; two pads along the tibialis anterior on the L. Patient educated on treatment.    Patient demonstrates decreased pain after modalities    PT Education - 08/02/17 1943    Education provided  Yes    Education Details  form/technique with exercise     Person(s) Educated  Patient    Methods  Explanation;Demonstration    Comprehension  Verbalized understanding;Returned demonstration          PT Long Term Goals - 07/20/17 1347      PT LONG TERM GOAL #1   Title  Patient will be independent with HEP to continue benefits of therapy after discharge    Baseline  Heavy cues for Ther ex performance; 06/28/2017: requires cueing for exercise; 07/20/2017: Requires cueing for exercise mod    Time  6    Period  Weeks    Status  On-going      PT LONG TERM GOAL #2   Title  Patient will have a worst pain of a 3/10 in the past week to indicate improvement at rest with knee pain.     Baseline  10/10 worst pain; 06/28/2017: 7/10; 07/20/2017: 6/10    Time  6    Period  Weeks    Status  On-going      PT LONG TERM GOAL #3   Title  Patient will improve LEFS score to 80/80 to indicate improvement with running without increase in pain.     Baseline  74/80 LEFS; 06/28/2017: 72/80; 07/20/2017: 76/80    Time  6    Period  Weeks    Status  On-going      PT LONG TERM GOAL #4   Title  Patient will be able to workout at her exercise class without increase in pain with activities.    Baseline  Increase in pain; 06/28/2017: Increase in pain with performance    Time  6    Period  Weeks    Status  New            Plan - 08/02/17 1944    Clinical Impression Statement  Patient demonstrates reporduction of knee pain after palpating her tibialis anterior indicating lower leg muscular contributing to increased pain. Patient demonstrates difficulty with performing dorsiflexion on the L side with decreased AROM which improves with STM. Patient will benefit from further skilled therapy to return to prior level of function.     Rehab Potential  Fair    Clinical Impairments Affecting Rehab Potential  (+) highly motivation (-) 3 months of unrelenting pain    PT Frequency  2x / week    PT Duration  6 weeks    PT Treatment/Interventions  Electrical Stimulation;Iontophoresis  /ml Dexamethasone;Cryotherapy;Moist Heat;Ultrasound;Stair training;Gait training;Therapeutic activities;Therapeutic exercise;Balance training;Neuromuscular re-education;Manual techniques;Patient/family education;Passive range of motion;Dry needling    PT Next Visit Plan  Progress knee coordination and mobility    PT Home Exercise Plan  See education    Consulted  and Agree with Plan of Care  Patient       Patient will benefit from skilled therapeutic intervention in order to improve the following deficits and impairments:  Abnormal gait, Decreased coordination, Decreased endurance, Decreased activity tolerance, Decreased range of motion, Decreased strength, Decreased balance, Difficulty walking, Hypomobility, Increased muscle spasms  Visit Diagnosis: Muscle weakness (generalized)  Acute pain of left knee     Problem List Patient Active Problem List   Diagnosis Date Noted  . Dysarthria 10/10/2014  . 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 LLQ pain 11/07/2013  . Carpal tunnel syndrome of right wrist 08/08/2013  . De Quervain's disease (radial styloid tenosynovitis) 05/10/2013  . Ganglion cyst of wrist 04/26/2013  . Chronic pelvic pain in female 05/23/2012  . History of sinus tachycardia 10/07/2011  . Migraine 10/07/2011  . Obesity, unspecified 10/07/2011  . Moderate obstructive sleep apnea 09/23/2011  . APL (antiphospholipid syndrome) (HCC) 11/05/2010  . Metabolic syndrome 11/04/2010  . Prediabetes 11/04/2010  . Prehypertension 11/04/2010  . Seasonal allergies 11/04/2010  . Vitamin D deficiency disease 11/04/2010  . Chronic asthma, mild persistent, uncomplicated 10/20/2010  . Polycystic ovarian syndrome 10/20/2010    Myrene Galas, PT DPT 08/02/2017, 7:47 PM  [place] [facility] PHYSICAL AND SPORTS MEDICINE [address] [place], [address] Phone: [phone]   Fax:  [phone]  Name: Kasia Trego MRN:  [id number] Date of Birth: May 26, 1980

## 2017-08-08 ENCOUNTER — Ambulatory Visit: Payer: Medicaid Other

## 2017-08-08 DIAGNOSIS — M6281 Muscle weakness (generalized): Secondary | ICD-10-CM

## 2017-08-08 DIAGNOSIS — M25562 Pain in left knee: Secondary | ICD-10-CM

## 2017-08-08 NOTE — Therapy (Signed)
[place] [facility] [address], [place], [address] Phone: [phone]   Fax:  [phone]  Physical Therapy Treatment  Patient Details  Name: Amber Ochoa MRN: [id number] Date of Birth: 06-12-1980 Referring Provider: Juanell Fairly MD   Encounter Date: 08/08/2017  PT End of Session - 08/08/17 1908    Visit Number  18    Number of Visits  21    Date for PT Re-Evaluation  08/17/17    Authorization Type  MEDICAID    PT Start Time  1845    PT Stop Time  1930    PT Time Calculation (min)  45 min    Activity Tolerance  Patient tolerated treatment well    Behavior During Therapy  [facility] for tasks assessed/performed       Past Medical History:  Diagnosis Date  . Asthma   . Blood clotting disorder (HCC)   . Carpal tunnel syndrome of right wrist 08/08/2013  . Chronic asthma, mild persistent, uncomplicated 10/20/2010  . Chronic pelvic pain in female 05/23/2012  . De Quervain's disease (radial styloid tenosynovitis) 05/10/2013  . Dysarthria 10/10/2014  . Endometriosis   . Ganglion cyst of wrist 04/26/2013  . Headache   . Heart murmur   . 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 Migraine 10/07/2011  . Moderate obstructive sleep apnea 09/23/2011   Overview:  June 15, 2014: SPLIT-NIGHT PSG @ [facility]: overall AHI 26.2/hr. The patient had CPAP applied. The maximal observed pressure was: 11 cm H2O. The recommended pressure is: 11-16 cm H2O.  CPAP set up on 08/28/14 by [facility]  . PCOS (polycystic ovarian syndrome)   . Polycystic ovarian syndrome 10/20/2010  . Seasonal allergies 11/04/2010  . Tachycardia   . Vitamin D deficiency disease 11/04/2010    Past Surgical History:  Procedure Laterality Date  . ABDOMINAL HYSTERECTOMY    . GALLBLADDER SURGERY    . Tendonitis Right     There were no vitals filed for this visit.  Subjective Assessment -  08/08/17 1847    Subjective  Patient reports she 'turned weird' at work today which caused increased L knee pain. Patient states her knee has been hurting more ever since the 'weird turn'.     Pertinent History  History of LBP, Dequarivians, PCOS, clotting disorder, asthma     Limitations  House hold activities    Diagnostic tests  MRI: Increased swelling over the knee    Patient Stated Goals  Decrease pain, return to workout without modifications    Currently in Pain?  Yes    Pain Score  7     Pain Location  Knee    Pain Orientation  Left    Pain Descriptors / Indicators  Aching    Pain Type  Acute pain    Pain Onset  More than a month ago    Pain Frequency  Constant       TREATMENT   Manual Therapy: STM performed to quadriceps/hamstrings with patient positioned in long sitting to improve upon muscular guarding and pain. Active Release Techniques performed to patient's quad in sitting held for 2 x 30sec to promote improvement with muscle guarding and pain   Therapeutic Exercise: Seated ball kicks with 5# weight around ankle - x 20  SLRs in long sitting - x 10    Modalities ( ): with patient in long sitting One  ice pack placed along the L anterior knee, one large ice pack along lower leg with patient positioned in sitting to decrease pain and spasms within the musculature attaching to the L knee. High volt placed along the superior and inferior R and L sides of the patella to decrease increased pain and spasms; two large pads on the superior and inferior aspects of the L patella; two pads along the tibialis anterior on the L. Patient educated on treatment.    Patient demonstrates decreased pain after modalities  PT Education - 08/08/17 1908    Education provided  Yes    Education Details  form/technique with exercise    Person(s) Educated  Patient    Methods  Explanation;Demonstration    Comprehension  Verbalized understanding;Returned demonstration          PT Long  Term Goals - 07/20/17 1347      PT LONG TERM GOAL #1   Title  Patient will be independent with HEP to continue benefits of therapy after discharge    Baseline  Heavy cues for Ther ex performance; 06/28/2017: requires cueing for exercise; 07/20/2017: Requires cueing for exercise mod    Time  6    Period  Weeks    Status  On-going      PT LONG TERM GOAL #2   Title  Patient will have a worst pain of a 3/10 in the past week to indicate improvement at rest with knee pain.     Baseline  10/10 worst pain; 06/28/2017: 7/10; 07/20/2017: 6/10    Time  6    Period  Weeks    Status  On-going      PT LONG TERM GOAL #3   Title  Patient will improve LEFS score to 80/80 to indicate improvement with running without increase in pain.     Baseline  74/80 LEFS; 06/28/2017: 72/80; 07/20/2017: 76/80    Time  6    Period  Weeks    Status  On-going      PT LONG TERM GOAL #4   Title  Patient will be able to workout at her exercise class without increase in pain with activities.    Baseline  Increase in pain; 06/28/2017: Increase in pain with performance    Time  6    Period  Weeks    Status  New            Plan - 08/08/17 1917    Clinical Impression Statement  Focused on decreasing increased knee pain after patient aggravated pain yesterday. Patient demonstrates increased pain with movement of knee in both flexion and extension looks to visually minor swelling around the knee/knee cap. Patient demonstrates difficulty with performing exercises but reports decreased pain after modalities treatment. Pt will benefit from further skilled therapy focused on decreasing pain to return to prior level of function.     Rehab Potential  Fair    Clinical Impairments Affecting Rehab Potential  (+) highly motivation (-) 3 months of unrelenting pain    PT Frequency  2x / week    PT Duration  6 weeks    PT Treatment/Interventions  Electrical Stimulation;Iontophoresis /ml Dexamethasone;Cryotherapy;Moist  Heat;Ultrasound;Stair training;Gait training;Therapeutic activities;Therapeutic exercise;Balance training;Neuromuscular re-education;Manual techniques;Patient/family education;Passive range of motion;Dry needling    PT Next Visit Plan  Progress knee coordination and mobility    PT Home Exercise Plan  See education    Consulted and Agree with Plan of Care  Patient       Patient will  benefit from skilled therapeutic intervention in order to improve the following deficits and impairments:  Abnormal gait, Decreased coordination, Decreased endurance, Decreased activity tolerance, Decreased range of motion, Decreased strength, Decreased balance, Difficulty walking, Hypomobility, Increased muscle spasms  Visit Diagnosis: Acute pain of left knee  Muscle weakness (generalized)     Problem List Patient Active Problem List   Diagnosis Date Noted  . Dysarthria 10/10/2014  . 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 LLQ pain 11/07/2013  . Carpal tunnel syndrome of right wrist 08/08/2013  . De Quervain's disease (radial styloid tenosynovitis) 05/10/2013  . Ganglion cyst of wrist 04/26/2013  . Chronic pelvic pain in female 05/23/2012  . History of sinus tachycardia 10/07/2011  . Migraine 10/07/2011  . Obesity, unspecified 10/07/2011  . Moderate obstructive sleep apnea 09/23/2011  . APL (antiphospholipid syndrome) (HCC) 11/05/2010  . Metabolic syndrome 11/04/2010  . Prediabetes 11/04/2010  . Prehypertension 11/04/2010  . Seasonal allergies 11/04/2010  . Vitamin D deficiency disease 11/04/2010  . Chronic asthma, mild persistent, uncomplicated 10/20/2010  . Polycystic ovarian syndrome 10/20/2010    Myrene Galas, PT DPT 08/08/2017, 7:24 PM  [place] [facility] PHYSICAL AND SPORTS MEDICINE [address] [place], [address] Phone: [phone]   Fax:  [phone]  Name: Amber Ochoa MRN: [id number] Date of Birth: Dec 05, 1980

## 2017-08-11 ENCOUNTER — Ambulatory Visit: Payer: Medicaid Other

## 2017-08-11 DIAGNOSIS — M6281 Muscle weakness (generalized): Secondary | ICD-10-CM

## 2017-08-11 DIAGNOSIS — M25562 Pain in left knee: Secondary | ICD-10-CM

## 2017-08-11 NOTE — Therapy (Signed)
[place] [facility] [address] [place], [address] Phone: [phone]   Fax:  [phone]  Physical Therapy Treatment  Patient Details  Name: Amber Ochoa MRN: [id number] Date of Birth: 1981/02/20 Referring Provider: Juanell Fairly MD   Encounter Date: 08/11/2017  PT End of Session - 08/11/17 1812    Visit Number  19    Number of Visits  21    Date for PT Re-Evaluation  08/17/17    Authorization Type  MEDICAID    PT Start Time  1730    PT Stop Time  1815    PT Time Calculation (min)  45 min    Activity Tolerance  Patient tolerated treatment well    Behavior During Therapy  [facility] for tasks assessed/performed       Past Medical History:  Diagnosis Date  . Asthma   . Blood clotting disorder (HCC)   . Carpal tunnel syndrome of right wrist 08/08/2013  . Chronic asthma, mild persistent, uncomplicated 10/20/2010  . Chronic pelvic pain in female 05/23/2012  . De Quervain's disease (radial styloid tenosynovitis) 05/10/2013  . Dysarthria 10/10/2014  . Endometriosis   . Ganglion cyst of wrist 04/26/2013  . Headache   . Heart murmur   . 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 Migraine 10/07/2011  . Moderate obstructive sleep apnea 09/23/2011   Overview:  June 15, 2014: SPLIT-NIGHT PSG @ [facility]: overall AHI 26.2/hr. The patient had CPAP applied. The maximal observed pressure was: 11 cm H2O. The recommended pressure is: 11-16 cm H2O.  CPAP set up on 08/28/14 by [facility]  . PCOS (polycystic ovarian syndrome)   . Polycystic ovarian syndrome 10/20/2010  . Seasonal allergies 11/04/2010  . Tachycardia   . Vitamin D deficiency disease 11/04/2010    Past Surgical History:  Procedure Laterality Date  . ABDOMINAL HYSTERECTOMY    . GALLBLADDER SURGERY    . Tendonitis Right     There were no vitals filed for this visit.  Subjective Assessment -  08/11/17 1809    Subjective  Patient reports increased pain with standing yesterday which 'brought her to tears' and reports her knee has been stiff since the her race 3 weeks.     Pertinent History  History of LBP, Dequarivians, PCOS, clotting disorder, asthma     Limitations  House hold activities    Diagnostic tests  MRI: Increased swelling over the knee    Patient Stated Goals  Decrease pain, return to workout without modifications    Currently in Pain?  Yes    Pain Score  5     Pain Location  Knee    Pain Orientation  Left    Pain Descriptors / Indicators  Aching    Pain Type  Acute pain    Pain Onset  More than a month ago    Pain Frequency  Constant        TREATMENT   Manual Therapy: STM performed to quadriceps/hamstrings/gastrocs with patient positioned in long sitting to improve upon muscular guarding and pain. Active Release Techniques performed to patient's quad in sitting held for 2 x 30sec to promote improvement with muscle guarding and pain   Modalities ( ): with patient in long sitting One ice pack placed along the L anterior knee, one large ice pack along lower leg with patient positioned in sitting to decrease pain and spasms  within the musculature attaching to the L knee. High volt placed along the superior and inferior R and L sides of the patella to decrease increased pain and spasms; two large pads on the superior and inferior aspects of the L patella; two pads along the tibialis anterior on the L. Patient educated on treatment.    Patient demonstrates decreased pain after modalities   PT Education - 08/11/17 1811    Education provided  Yes    Education Details  form/technique with exercise    Person(s) Educated  Patient    Methods  Explanation;Demonstration    Comprehension  Verbalized understanding;Returned demonstration          PT Long Term Goals - 07/20/17 1347      PT LONG TERM GOAL #1   Title  Patient will be independent with HEP to continue  benefits of therapy after discharge    Baseline  Heavy cues for Ther ex performance; 06/28/2017: requires cueing for exercise; 07/20/2017: Requires cueing for exercise mod    Time  6    Period  Weeks    Status  On-going      PT LONG TERM GOAL #2   Title  Patient will have a worst pain of a 3/10 in the past week to indicate improvement at rest with knee pain.     Baseline  10/10 worst pain; 06/28/2017: 7/10; 07/20/2017: 6/10    Time  6    Period  Weeks    Status  On-going      PT LONG TERM GOAL #3   Title  Patient will improve LEFS score to 80/80 to indicate improvement with running without increase in pain.     Baseline  74/80 LEFS; 06/28/2017: 72/80; 07/20/2017: 76/80    Time  6    Period  Weeks    Status  On-going      PT LONG TERM GOAL #4   Title  Patient will be able to workout at her exercise class without increase in pain with activities.    Baseline  Increase in pain; 06/28/2017: Increase in pain with performance    Time  6    Period  Weeks    Status  New            Plan - 08/11/17 1812    Clinical Impression Statement  Continued to focus on decreasing knee pain and spasms today performing manual therapy to the area. Patient reports increased fatigue from performing quadricep exercises today in her exercise class today. Patient's knee is less reactive to manual therapy today and is able to tolerate greater pressure. Patient will benefit from further skilled therapy focused on improving limitations to return to prior level of function.     Rehab Potential  Fair    Clinical Impairments Affecting Rehab Potential  (+) highly motivation (-) 3 months of unrelenting pain    PT Frequency  2x / week    PT Duration  6 weeks    PT Treatment/Interventions  Electrical Stimulation;Iontophoresis /ml Dexamethasone;Cryotherapy;Moist Heat;Ultrasound;Stair training;Gait training;Therapeutic activities;Therapeutic exercise;Balance training;Neuromuscular re-education;Manual  techniques;Patient/family education;Passive range of motion;Dry needling    PT Next Visit Plan  Progress knee coordination and mobility    PT Home Exercise Plan  See education    Consulted and Agree with Plan of Care  Patient       Patient will benefit from skilled therapeutic intervention in order to improve the following deficits and impairments:  Abnormal gait, Decreased coordination, Decreased endurance, Decreased activity tolerance,  Decreased range of motion, Decreased strength, Decreased balance, Difficulty walking, Hypomobility, Increased muscle spasms, Decreased knowledge of precautions  Visit Diagnosis: Muscle weakness (generalized)  Acute pain of left knee     Problem List Patient Active Problem List   Diagnosis Date Noted  . Dysarthria 10/10/2014  . 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 LLQ pain 11/07/2013  . Carpal tunnel syndrome of right wrist 08/08/2013  . De Quervain's disease (radial styloid tenosynovitis) 05/10/2013  . Ganglion cyst of wrist 04/26/2013  . Chronic pelvic pain in female 05/23/2012  . History of sinus tachycardia 10/07/2011  . Migraine 10/07/2011  . Obesity, unspecified 10/07/2011  . Moderate obstructive sleep apnea 09/23/2011  . APL (antiphospholipid syndrome) (HCC) 11/05/2010  . Metabolic syndrome 11/04/2010  . Prediabetes 11/04/2010  . Prehypertension 11/04/2010  . Seasonal allergies 11/04/2010  . Vitamin D deficiency disease 11/04/2010  . Chronic asthma, mild persistent, uncomplicated 10/20/2010  . Polycystic ovarian syndrome 10/20/2010    Myrene Galas, PT DPT 08/11/2017, 6:30 PM  [place] [facility] PHYSICAL AND SPORTS MEDICINE [address] [place], [address] Phone: [phone]   Fax:  [phone]  Name: Amber Ochoa MRN: [id number] Date of Birth: May 10, 1980

## 2017-08-16 ENCOUNTER — Ambulatory Visit: Payer: Medicaid Other

## 2017-08-16 DIAGNOSIS — M6281 Muscle weakness (generalized): Secondary | ICD-10-CM | POA: Diagnosis not present

## 2017-08-16 DIAGNOSIS — M25562 Pain in left knee: Secondary | ICD-10-CM

## 2017-08-16 NOTE — Therapy (Signed)
[facility] [address], [place], [address] Phone: [phone]   Fax:  [phone]  Physical Therapy Treatment  Patient Details  Name: Amber Ochoa MRN: [id number] Date of Birth: Aug 07, 1980 Referring Provider: Juanell Fairly MD   Encounter Date: 08/16/2017  PT End of Session - 08/16/17 1925    Visit Number  20    Number of Visits  21    Date for PT Re-Evaluation  08/17/17    Authorization Type  MEDICAID    PT Start Time  1847    PT Stop Time  1935    PT Time Calculation (min)  48 min    Activity Tolerance  Patient tolerated treatment well    Behavior During Therapy  [facility] for tasks assessed/performed       Past Medical History:  Diagnosis Date  . Asthma   . Blood clotting disorder (HCC)   . Carpal tunnel syndrome of right wrist 08/08/2013  . Chronic asthma, mild persistent, uncomplicated 10/20/2010  . Chronic pelvic pain in female 05/23/2012  . De Quervain's disease (radial styloid tenosynovitis) 05/10/2013  . Dysarthria 10/10/2014  . Endometriosis   . Ganglion cyst of wrist 04/26/2013  . Headache   . Heart murmur   . 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 Migraine 10/07/2011  . Moderate obstructive sleep apnea 09/23/2011   Overview:  June 15, 2014: SPLIT-NIGHT PSG @ [facility]: overall AHI 26.2/hr. The patient had CPAP applied. The maximal observed pressure was: 11 cm H2O. The recommended pressure is: 11-16 cm H2O.  CPAP set up on 08/28/14 by [facility]  . PCOS (polycystic ovarian syndrome)   . Polycystic ovarian syndrome 10/20/2010  . Seasonal allergies 11/04/2010  . Tachycardia   . Vitamin D deficiency disease 11/04/2010    Past Surgical History:  Procedure Laterality Date  . ABDOMINAL HYSTERECTOMY    . GALLBLADDER SURGERY    . Tendonitis Right     There were no vitals filed for this visit.  Subjective Assessment -  08/16/17 1849    Subjective  Patient reports increased pain along her calfs since going to her race this weekend. Patient states increased pain along her calves B. Patient states her calfs are tight overall today.     Pertinent History  History of LBP, Dequarivians, PCOS, clotting disorder, asthma     Limitations  House hold activities    Diagnostic tests  MRI: Increased swelling over the knee    Patient Stated Goals  Decrease pain, return to workout without modifications    Currently in Pain?  Yes    Pain Score  5     Pain Location  Knee & Lower Leg    Pain Orientation  Left;Right    Pain Descriptors / Indicators  Aching    Pain Type  Acute pain    Pain Onset  More than a month ago    Pain Frequency  Constant       TREATMENT   Manual Therapy: STM performed to quadriceps/hamstrings/gastrocs with patient positioned in long sitting to improve upon muscular guarding and pain.  Active Release Techniques performed to patient's gastrocnemius in sitting held for 2 x 30sec to promote improvement with muscle guarding and pain Patella mobilizations - 2 x 30secs inf/sup; laterally R&L   Modalities ( ): with patient in long sitting One ice pack placed along the L anterior knee,  one large ice pack along lower leg with patient positioned in sitting to decrease pain and spasms within the musculature attaching to the L knee. High volt placed along the superior and inferior of the patella to decrease increased pain and spasms; two large pads on the gastrocnemius  on the L. Patient educated on treatment.    Patient demonstrates decreased pain after modalities  PT Education - 08/16/17 1925    Education provided  Yes    Education Details  form/technique with exercise    Person(s) Educated  Patient    Methods  Demonstration;Explanation    Comprehension  Verbalized understanding;Returned demonstration          PT Long Term Goals - 07/20/17 1347      PT LONG TERM GOAL #1   Title  Patient  will be independent with HEP to continue benefits of therapy after discharge    Baseline  Heavy cues for Ther ex performance; 06/28/2017: requires cueing for exercise; 07/20/2017: Requires cueing for exercise mod    Time  6    Period  Weeks    Status  On-going      PT LONG TERM GOAL #2   Title  Patient will have a worst pain of a 3/10 in the past week to indicate improvement at rest with knee pain.     Baseline  10/10 worst pain; 06/28/2017: 7/10; 07/20/2017: 6/10    Time  6    Period  Weeks    Status  On-going      PT LONG TERM GOAL #3   Title  Patient will improve LEFS score to 80/80 to indicate improvement with running without increase in pain.     Baseline  74/80 LEFS; 06/28/2017: 72/80; 07/20/2017: 76/80    Time  6    Period  Weeks    Status  On-going      PT LONG TERM GOAL #4   Title  Patient will be able to workout at her exercise class without increase in pain with activities.    Baseline  Increase in pain; 06/28/2017: Increase in pain with performance    Time  6    Period  Weeks    Status  New            Plan - 08/16/17 1931    Clinical Impression Statement  Patient continues to have increased pain and spasms along the L knee and performed STM along the muscular to decrease pain and spasms. Patient demonstrates improvement with muscular spasms after manual therapy indicating decreased spasms. Educated patient heavily on limiting higher level of activity to decrease increased pain cycle in the knee and lower leg. Patient will benefit from further skilled therapy to return to prior level of function.     Rehab Potential  Fair    Clinical Impairments Affecting Rehab Potential  (+) highly motivation (-) 3 months of unrelenting pain    PT Frequency  2x / week    PT Duration  6 weeks    PT Treatment/Interventions  Electrical Stimulation;Iontophoresis /ml Dexamethasone;Cryotherapy;Moist Heat;Ultrasound;Stair training;Gait training;Therapeutic activities;Therapeutic exercise;Balance  training;Neuromuscular re-education;Manual techniques;Patient/family education;Passive range of motion;Dry needling    PT Next Visit Plan  Progress knee coordination and mobility    PT Home Exercise Plan  See education    Consulted and Agree with Plan of Care  Patient       Patient will benefit from skilled therapeutic intervention in order to improve the following deficits and impairments:  Abnormal gait, Decreased coordination,  Decreased endurance, Decreased activity tolerance, Decreased range of motion, Decreased strength, Decreased balance, Difficulty walking, Hypomobility, Increased muscle spasms, Decreased knowledge of precautions  Visit Diagnosis: Acute pain of left knee  Muscle weakness (generalized)     Problem List Patient Active Problem List   Diagnosis Date Noted  . Dysarthria 10/10/2014  . 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 LLQ pain 11/07/2013  . Carpal tunnel syndrome of right wrist 08/08/2013  . De Quervain's disease (radial styloid tenosynovitis) 05/10/2013  . Ganglion cyst of wrist 04/26/2013  . Chronic pelvic pain in female 05/23/2012  . History of sinus tachycardia 10/07/2011  . Migraine 10/07/2011  . Obesity, unspecified 10/07/2011  . Moderate obstructive sleep apnea 09/23/2011  . APL (antiphospholipid syndrome) (HCC) 11/05/2010  . Metabolic syndrome 11/04/2010  . Prediabetes 11/04/2010  . Prehypertension 11/04/2010  . Seasonal allergies 11/04/2010  . Vitamin D deficiency disease 11/04/2010  . Chronic asthma, mild persistent, uncomplicated 10/20/2010  . Polycystic ovarian syndrome 10/20/2010    Myrene Galas, PT DPT 08/16/2017, 7:40 PM  [place] [facility] PHYSICAL AND SPORTS MEDICINE [address] [place], [address] Phone: [phone]   Fax:  [phone]  Name: Amber Ochoa MRN: [id number] Date of Birth: 11-Mar-1981

## 2017-08-18 ENCOUNTER — Ambulatory Visit: Payer: Medicaid Other

## 2017-08-18 DIAGNOSIS — M6281 Muscle weakness (generalized): Secondary | ICD-10-CM

## 2017-08-18 DIAGNOSIS — M25562 Pain in left knee: Secondary | ICD-10-CM

## 2017-08-18 NOTE — Therapy (Signed)
[place] [facility] PH[facility]ICINE [address] [place], [address] Phone: [phone]   Fax:  [phone]  Physical Therapy Treatment  Patient Details  Name: Amber Ochoa MRN: [id number] Date of Birth: [birth year] Referring Provider: Juanell Fairly MD   Encounter Date: 08/18/2017  PT End of Session - 08/18/17 1825    Visit Number  21    Number of Visits  29    Date for PT Re-Evaluation  09/08/17    Authorization Type  MEDICAID    PT Start Time  1730    PT Stop Time  1815    PT Time Calculation (min)  45 min    Activity Tolerance  Patient tolerated treatment well    Behavior During Therapy  [employer] for tasks assessed/performed       Past Medical History:  Diagnosis Date  . Asthma   . Blood clotting disorder (HCC)   . Carpal tunnel syndrome of right wrist 08/08/2013  . Chronic asthma, mild persistent, uncomplicated 10/20/2010  . Chronic pelvic pain in female 05/23/2012  . De Quervain's disease (radial styloid tenosynovitis) 05/10/2013  . Dysarthria 10/10/2014  . Endometriosis   . Ganglion cyst of wrist 04/26/2013  . Headache   . Heart murmur   . 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 Migraine 10/07/2011  . Moderate obstructive sleep apnea 09/23/2011   Overview:  June 15, 2014: SPLIT-NIGHT PSG @ [facility]: overall AHI 26.2/hr. The patient had CPAP applied. The maximal observed pressure was: 11 cm H2O. The recommended pressure is: 11-16 cm H2O.  CPAP set up on 08/28/14 by [facility]  . PCOS (polycystic ovarian syndrome)   . Polycystic ovarian syndrome 10/20/2010  . Seasonal allergies 11/04/2010  . Tachycardia   . Vitamin D deficiency disease 11/04/2010    Past Surgical History:  Procedure Laterality Date  . ABDOMINAL HYSTERECTOMY    . GALLBLADDER SURGERY    . Tendonitis Right     There were no vitals filed for this visit.  Subjective Assessment -  08/18/17 1813    Subjective  Patient reports pain in the knee is slighlty improved today but she continues to have pain in the calf. Patient states increased pain along the anterior knee is a horseshoe like fashion.     Pertinent History  History of LBP, Dequarivians, PCOS, clotting disorder, asthma     Limitations  House hold activities    Diagnostic tests  MRI: Increased swelling over the knee    Patient Stated Goals  Decrease pain, return to workout without modifications    Currently in Pain?  Yes    Pain Score  5     Pain Location  Knee    Pain Orientation  Left    Pain Descriptors / Indicators  Aching    Pain Type  Acute pain    Pain Onset  More than a month ago    Pain Frequency  Constant       TREATMENT   Manual Therapy: STM performed to quadriceps/hamstrings/gastrocs with patient positioned in long sitting to improve upon muscular guarding and pain.  Active Release Techniques performed to patient's gastrocnemius in sitting held for 2 x 30sec to promote improvement with muscle guarding and pain Patella mobilizations - 2 x 30secs inf/sup; laterally R&L     Modalities ( ): with patient in long sitting One ice pack placed along the L anterior  knee, one large ice pack along lower leg with patient positioned in sitting to decrease pain and spasms within the musculature attaching to the L knee. High volt placed along the superior and inferior of the patella to decrease increased pain and spasms; two large pads on the gastrocnemius  on the L. Patient educated on treatment.   Dry Needling (5 min) (3) .25x placed along the L quadriceps to decrease pain within the musculature with patient in long sitting. Patient demonstrates slight decrease in pain after performance. Patient educated on risks of therapy and verbally consents to treatment.    Patient demonstrates decreased pain after modalities and manual therapy    PT Education - 08/18/17 1815    Education provided  Yes     Education Details  form/technique with exercise    Person(s) Educated  Patient    Methods  Explanation;Demonstration    Comprehension  Verbalized understanding;Returned demonstration          PT Long Term Goals - 08/18/17 1826      PT LONG TERM GOAL #1   Title  Patient will be independent with HEP to continue benefits of therapy after discharge    Baseline  Heavy cues for Ther ex performance; 06/28/2017: requires cueing for exercise; 07/20/2017: Requires cueing for exercise mod; 08/18/2017: Independent with HEP    Time  6    Period  Weeks    Status  Achieved      PT LONG TERM GOAL #2   Title  Patient will have a worst pain of a 3/10 in the past week to indicate improvement at rest with knee pain.     Baseline  10/10 worst pain; 06/28/2017: 7/10; 07/20/2017: 6/10; 08/18/2017: 5/10    Time  6    Period  Weeks    Status  On-going      PT LONG TERM GOAL #3   Title  Patient will improve LEFS score to 80/80 to indicate improvement with running without increase in pain.     Baseline  74/80 LEFS; 06/28/2017: 72/80; 07/20/2017: 76/80; 08/18/2017: 77/80    Time  6    Period  Weeks    Status  On-going      PT LONG TERM GOAL #4   Title  Patient will be able to workout at her exercise class without increase in pain with activities.    Baseline  Increase in pain; 06/28/2017: Increase in pain with performance; 08/18/2017: no increase in pain with modifications    Time  6    Period  Weeks    Status  New            Plan - 08/18/17 1900    Clinical Impression Statement  Patient is making progress towards long term goals with improvement in pain over time, improvement in functional measures, and improvement with not performing as high intensity exercises as she was in the past. Patient continues to demosntrate increased pain thoruhgout the knee and calf musculature but reports the pain in the calf is worse compared to the knee. Been address calf weakness and pmusuclar spasms with some success in  decreasing knee pain. Patient will benefit from further skilled therapy to return to prior level of function.     Rehab Potential  Fair    Clinical Impairments Affecting Rehab Potential  (+) highly motivation (-) 3 months of unrelenting pain    PT Frequency  2x / week    PT Duration  6 weeks  PT Treatment/Interventions  Electrical Stimulation;Iontophoresis /ml Dexamethasone;Cryotherapy;Moist Heat;Ultrasound;Stair training;Gait training;Therapeutic activities;Therapeutic exercise;Balance training;Neuromuscular re-education;Manual techniques;Patient/family education;Passive range of motion;Dry needling    PT Next Visit Plan  Progress knee coordination and mobility    PT Home Exercise Plan  See education    Consulted and Agree with Plan of Care  Patient       Patient will benefit from skilled therapeutic intervention in order to improve the following deficits and impairments:  Abnormal gait, Decreased coordination, Decreased endurance, Decreased activity tolerance, Decreased range of motion, Decreased strength, Decreased balance, Difficulty walking, Hypomobility, Increased muscle spasms, Decreased knowledge of precautions  Visit Diagnosis: Acute pain of left knee  Muscle weakness (generalized)     Problem List Patient Active Problem List   Diagnosis Date Noted  . Dysarthria 10/10/2014  . 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 LLQ pain 11/07/2013  . Carpal tunnel syndrome of right wrist 08/08/2013  . De Quervain's disease (radial styloid tenosynovitis) 05/10/2013  . Ganglion cyst of wrist 04/26/2013  . Chronic pelvic pain in female 05/23/2012  . History of sinus tachycardia 10/07/2011  . Migraine 10/07/2011  . Obesity, unspecified 10/07/2011  . Moderate obstructive sleep apnea 09/23/2011  . APL (antiphospholipid syndrome) (HCC) 11/05/2010  . Metabolic syndrome 11/04/2010  . Prediabetes 11/04/2010  . Prehypertension 11/04/2010  . Seasonal allergies 11/04/2010   . Vitamin D deficiency disease 11/04/2010  . Chronic asthma, mild persistent, uncomplicated 10/20/2010  . Polycystic ovarian syndrome 10/20/2010    Myrene Galas, PT DPT 08/18/2017, 7:07 PM  [place] [facility] PHYS[facility] AND SPORTS MEDICINE [address] [place], [address] Phone: [phone]   Fax:  [phone]  Name: Amber Ochoa MRN: [id number] Date of Birth: 1980/08/05

## 2017-08-23 ENCOUNTER — Ambulatory Visit: Payer: Medicaid Other

## 2017-08-30 ENCOUNTER — Ambulatory Visit: Payer: Medicaid Other | Attending: Orthopedic Surgery

## 2017-08-30 DIAGNOSIS — M25562 Pain in left knee: Secondary | ICD-10-CM | POA: Insufficient documentation

## 2017-08-30 DIAGNOSIS — M6281 Muscle weakness (generalized): Secondary | ICD-10-CM | POA: Insufficient documentation

## 2017-08-30 NOTE — Therapy (Signed)
[place] [facility] [address] [place], [address] Phone: [phone]   Fax:  [phone]  Physical Therapy Treatment  Patient Details  Name: Amber Ochoa MRN: [id number] Date of Birth: [birth year] Referring Provider: Juanell Fairly MD   Encounter Date: 08/30/2017  PT End of Session - 08/30/17 1829    Visit Number  22    Number of Visits  29    Date for PT Re-Evaluation  09/08/17    Authorization Type  MEDICAID    PT Start Time  1820    PT Stop Time  1915    PT Time Calculation (min)  55 min    Activity Tolerance  Patient tolerated treatment well    Behavior During Therapy  [facility] for tasks assessed/performed       Past Medical History:  Diagnosis Date  . Asthma   . Blood clotting disorder (HCC)   . Carpal tunnel syndrome of right wrist 08/08/2013  . Chronic asthma, mild persistent, uncomplicated 10/20/2010  . Chronic pelvic pain in female 05/23/2012  . De Quervain's disease (radial styloid tenosynovitis) 05/10/2013  . Dysarthria 10/10/2014  . Endometriosis   . Ganglion cyst of wrist 04/26/2013  . Headache   . Heart murmur   . 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 Migraine 10/07/2011  . Moderate obstructive sleep apnea 09/23/2011   Overview:  June 15, 2014: SPLIT-NIGHT PSG @ [facility]: overall AHI 26.2/hr. The patient had CPAP applied. The maximal observed pressure was: 11 cm H2O. The recommended pressure is: 11-16 cm H2O.  CPAP set up on 08/28/14 by [facility]  . PCOS (polycystic ovarian syndrome)   . Polycystic ovarian syndrome 10/20/2010  . Seasonal allergies 11/04/2010  . Tachycardia   . Vitamin D deficiency disease 11/04/2010    Past Surgical History:  Procedure Laterality Date  . ABDOMINAL HYSTERECTOMY    . GALLBLADDER SURGERY    . Tendonitis Right     There were no vitals filed for this visit.  Subjective Assessment -  08/30/17 1823    Subjective  Patient reports the knee is feeling tight today. Patient states increased pain with walking today and reports increased pain along her calf.     Pertinent History  History of LBP, Dequarivians, PCOS, clotting disorder, asthma     Limitations  House hold activities    Diagnostic tests  MRI: Increased swelling over the knee    Patient Stated Goals  Decrease pain, return to workout without modifications    Currently in Pain?  Yes    Pain Score  4     Pain Location  Knee    Pain Orientation  Left    Pain Descriptors / Indicators  Aching    Pain Type  Acute pain    Pain Onset  More than a month ago    Pain Frequency  Constant        TREATMENT   Manual Therapy: STM performed to quadriceps/hamstrings/adductors with patient positioned in long sitting to improve upon muscular guarding and pain.  Patella mobilizations - 2 x 30secs inf/sup; laterally R&L  Therapeutic Exercises: Single leg dead lifts - x 30 B Single leg squats - x 30 B Squats in standings - x 30 B    Modalities ( ): with patient in long sitting One ice pack placed along the L anterior knee, one large ice pack along lower  leg with patient positioned in sitting to decrease pain and spasms within the musculature attaching to the L knee. High volt placed along the superior and inferior of the patella to decrease increased pain and spasms; two large pads on the gastrocnemius and hamstring on the L. Patient educated on treatment.    Dry Needling (5 min) (3) .25x placed along the L quadriceps to decrease pain within the musculature with patient in long sitting. Patient demonstrates slight decrease in pain after performance. Patient educated on risks of therapy and verbally consents to treatment.    Patient demonstrates decreased pain after modalities and manual therapy    PT Education - 08/30/17 1829    Education provided  Yes    Education Details  form/technique with exercises     Person(s) Educated  Patient    Methods  Explanation;Demonstration    Comprehension  Verbalized understanding;Returned demonstration          PT Long Term Goals - 08/18/17 1826      PT LONG TERM GOAL #1   Title  Patient will be independent with HEP to continue benefits of therapy after discharge    Baseline  Heavy cues for Ther ex performance; 06/28/2017: requires cueing for exercise; 07/20/2017: Requires cueing for exercise mod; 08/18/2017: Independent with HEP    Time  6    Period  Weeks    Status  Achieved      PT LONG TERM GOAL #2   Title  Patient will have a worst pain of a 3/10 in the past week to indicate improvement at rest with knee pain.     Baseline  10/10 worst pain; 06/28/2017: 7/10; 07/20/2017: 6/10; 08/18/2017: 5/10    Time  6    Period  Weeks    Status  On-going      PT LONG TERM GOAL #3   Title  Patient will improve LEFS score to 80/80 to indicate improvement with running without increase in pain.     Baseline  74/80 LEFS; 06/28/2017: 72/80; 07/20/2017: 76/80; 08/18/2017: 77/80    Time  6    Period  Weeks    Status  On-going      PT LONG TERM GOAL #4   Title  Patient will be able to workout at her exercise class without increase in pain with activities.    Baseline  Increase in pain; 06/28/2017: Increase in pain with performance; 08/18/2017: no increase in pain with modifications    Time  6    Period  Weeks    Status  New            Plan - 08/30/17 1856    Clinical Impression Statement  Patient demonstrates improvement with pain today compared to previous visitation sessions. Patient demonstrates increased muscular spasms and pain along her hamstrings along the affected side which decreased after performing STM and dry needling to the area. Continued to focus on decreasing pain and improving long term strength and coordination. Patient will benefit from further skilled therapy to return to prior level of function.     Rehab Potential  Fair    Clinical Impairments  Affecting Rehab Potential  (+) highly motivation (-) 3 months of unrelenting pain    PT Frequency  2x / week    PT Duration  6 weeks    PT Treatment/Interventions  Electrical Stimulation;Iontophoresis 4mg /ml Dexamethasone;Cryotherapy;Moist Heat;Ultrasound;Stair training;Gait training;Therapeutic activities;Therapeutic exercise;Balance training;Neuromuscular re-education;Manual techniques;Patient/family education;Passive range of motion;Dry needling    PT Next Visit Plan  Progress knee coordination and mobility    PT Home Exercise Plan  See education    Consulted and Agree with Plan of Care  Patient       Patient will benefit from skilled therapeutic intervention in order to improve the following deficits and impairments:  Abnormal gait, Decreased coordination, Decreased endurance, Decreased activity tolerance, Decreased range of motion, Decreased strength, Decreased balance, Difficulty walking, Hypomobility, Increased muscle spasms, Decreased knowledge of precautions  Visit Diagnosis: Muscle weakness (generalized)  Acute pain of left knee     Problem List Patient Active Problem List   Diagnosis Date Noted  . Dysarthria 10/10/2014  . 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 LLQ pain 11/07/2013  . Carpal tunnel syndrome of right wrist 08/08/2013  . De Quervain's disease (radial styloid tenosynovitis) 05/10/2013  . Ganglion cyst of wrist 04/26/2013  . Chronic pelvic pain in female 05/23/2012  . History of sinus tachycardia 10/07/2011  . Migraine 10/07/2011  . Obesity, unspecified 10/07/2011  . Moderate obstructive sleep apnea 09/23/2011  . APL (antiphospholipid syndrome) (HCC) 11/05/2010  . Metabolic syndrome 11/04/2010  . Prediabetes 11/04/2010  . Prehypertension 11/04/2010  . Seasonal allergies 11/04/2010  . Vitamin D deficiency disease 11/04/2010  . Chronic asthma, mild persistent, uncomplicated 10/20/2010  . Polycystic ovarian syndrome 10/20/2010    Myrene GalasWesley  Sabriyah Wilcher, PT DPT 08/30/2017, 7:11 PM  [facility] [address] [place], [place], [address] Phone: [phone]   Fax:  [phone]  Name: Sarita HaverKrystal Star Sheerin MRN: [id number] Date of Birth: 03/29/1980

## 2017-09-01 ENCOUNTER — Ambulatory Visit: Payer: Medicaid Other

## 2017-09-01 DIAGNOSIS — M6281 Muscle weakness (generalized): Secondary | ICD-10-CM | POA: Diagnosis not present

## 2017-09-01 DIAGNOSIS — M25562 Pain in left knee: Secondary | ICD-10-CM

## 2017-09-01 NOTE — Therapy (Addendum)
[facility]ICINE [address] [place], [place], [address] Phone: [phone]   Fax:  [phone]  Physical Therapy Treatment  Patient Details  Name: Amber Ochoa MRN: [id number] Date of Birth: 01/02/1981 Referring Provider: Juanell FairlyKevin Krasinski MD   Encounter Date: 09/01/2017  PT End of Session - 09/01/17 1850    Visit Number  23    Number of Visits  29    Date for PT Re-Evaluation  09/08/17    Authorization Type  MEDICAID    PT Start Time  1735    PT Stop Time  1835    PT Time Calculation (min)  60 min    Activity Tolerance  Patient tolerated treatment well    Behavior During Therapy  [facility] for tasks assessed/performed       Past Medical History:  Diagnosis Date  . Asthma   . Blood clotting disorder (HCC)   . Carpal tunnel syndrome of right wrist 08/08/2013  . Chronic asthma, mild persistent, uncomplicated 10/20/2010  . Chronic pelvic pain in female 05/23/2012  . De Quervain's disease (radial styloid tenosynovitis) 05/10/2013  . Dysarthria 10/10/2014  . Endometriosis   . Ganglion cyst of wrist 04/26/2013  . Headache   . Heart murmur   . 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 Migraine 10/07/2011  . Moderate obstructive sleep apnea 09/23/2011   Overview:  June 15, 2014: SPLIT-NIGHT PSG @ [facility]: overall AHI 26.2/hr. The patient had CPAP applied. The maximal observed pressure was: 11 cm H2O. The recommended pressure is: 11-16 cm H2O.  CPAP set up on 08/28/14 by [facility]  . PCOS (polycystic ovarian syndrome)   . Polycystic ovarian syndrome 10/20/2010  . Seasonal allergies 11/04/2010  . Tachycardia   . Vitamin D deficiency disease 11/04/2010    Past Surgical History:  Procedure Laterality Date  . ABDOMINAL HYSTERECTOMY    . GALLBLADDER SURGERY    . Tendonitis Right     There were no vitals filed for this visit.  Subjective Assessment -  09/01/17 1817    Subjective  Patient reports that knee is sore and tight today. Pt also reported that it was "leg day" at her gym. Pt states that some days she feels that she is getting better and other days she feels the same. Pt wears knee brace during workouts.     Pertinent History  History of LBP, Dequarivians, PCOS, clotting disorder, asthma     Limitations  House hold activities    Diagnostic tests  MRI: Increased swelling over the knee    Patient Stated Goals  Decrease pain, return to workout without modifications    Currently in Pain?  Yes    Pain Score  4     Pain Location  Knee    Pain Orientation  Left    Pain Descriptors / Indicators  Tightness;Sore    Pain Type  Acute pain    Pain Onset  More than a month ago          TREATMENT  Manual Therapy STM performed to quadriceps insertion on patella (patient long sitting) and on distal hamstring (patient in prone) to decrease muscle guarding and reduce pain.  Therapeutic Exercises Single leg press -- 55# 20x2  Single leg TRX squats -- 15x2  Gastroc stretch 2x30s Soleus Stretch 2x30s  Modalities (15min): with patient in long sitting Oneice pack placed along the  L anterior knee, one large ice pack along lower leg with patient positioned in sitting to decrease pain and spasms within the musculature attaching to the L knee. High volt placed along the superior and inferior of the patella to decrease increased pain and spasms;twolarge pads on the gastrocnemius and hamstringon the L. Patient educated on treatment.  Patient educated on adding gastroc and soleus stretches to HEP. Overall, patient presents with decreased pain after therapeutic exercises and modalities.    PT Education - 09/01/17 1820    Education provided  Yes    Education Details  Form/technique with therpeutic exercises, proper soleus and gastroc stretching technique.     Person(s) Educated  Patient    Methods  Explanation;Demonstration    Comprehension   Verbalized understanding;Returned demonstration          PT Long Term Goals - 08/18/17 1826      PT LONG TERM GOAL #1   Title  Patient will be independent with HEP to continue benefits of therapy after discharge    Baseline  Heavy cues for Ther ex performance; 06/28/2017: requires cueing for exercise; 07/20/2017: Requires cueing for exercise mod; 08/18/2017: Independent with HEP    Time  6    Period  Weeks    Status  Achieved      PT LONG TERM GOAL #2   Title  Patient will have a worst pain of a 3/10 in the past week to indicate improvement at rest with knee pain.     Baseline  10/10 worst pain; 06/28/2017: 7/10; 07/20/2017: 6/10; 08/18/2017: 5/10    Time  6    Period  Weeks    Status  On-going      PT LONG TERM GOAL #3   Title  Patient will improve LEFS score to 80/80 to indicate improvement with running without increase in pain.     Baseline  74/80 LEFS; 06/28/2017: 72/80; 07/20/2017: 76/80; 08/18/2017: 77/80    Time  6    Period  Weeks    Status  On-going      PT LONG TERM GOAL #4   Title  Patient will be able to workout at her exercise class without increase in pain with activities.    Baseline  Increase in pain; 06/28/2017: Increase in pain with performance; 08/18/2017: no increase in pain with modifications    Time  6    Period  Weeks    Status  New            Plan - 09/01/17 1823    Clinical Impression Statement  Patient demonstrates improved tolerance to soft tissue mobilization and therapeutic exercises in today's session. Patient educated on importance of stretching gastroc and soleus after exercising. Overall, pain decreased after treatment. Continue focusing on overall strenghtening and coordination of LE exercises.  Patient presented with incr pain during SL TRX squat to chair depth., depth of squat was shortened and pain was alleviated demonstrating a decrease in LE strength. Patient will benefit from continued skilled PT in order to return to prior level of function.     Rehab Potential  Fair    Clinical Impairments Affecting Rehab Potential  (+) highly motivation (-) 3 months of unrelenting pain    PT Frequency  2x / week    PT Duration  6 weeks    PT Treatment/Interventions  Electrical Stimulation;Iontophoresis 4mg /ml Dexamethasone;Cryotherapy;Moist Heat;Ultrasound;Stair training;Gait training;Therapeutic activities;Therapeutic exercise;Balance training;Neuromuscular re-education;Manual techniques;Patient/family education;Passive range of motion;Dry needling    PT Next Visit Plan  Review  calf stretches added to HEP, add single leg strengthening and coordination exercises, patella mobilizations     PT Home Exercise Plan  See patient education    Consulted and Agree with Plan of Care  Patient       Patient will benefit from skilled therapeutic intervention in order to improve the following deficits and impairments:  Abnormal gait, Decreased coordination, Decreased endurance, Decreased activity tolerance, Decreased range of motion, Decreased strength, Decreased balance, Difficulty walking, Hypomobility, Increased muscle spasms, Decreased knowledge of precautions  Visit Diagnosis: Muscle weakness (generalized)  Acute pain of left knee     Problem List Patient Active Problem List   Diagnosis Date Noted  . Dysarthria 10/10/2014  . 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 LLQ pain 11/07/2013  . Carpal tunnel syndrome of right wrist 08/08/2013  . De Quervain's disease (radial styloid tenosynovitis) 05/10/2013  . Ganglion cyst of wrist 04/26/2013  . Chronic pelvic pain in female 05/23/2012  . History of sinus tachycardia 10/07/2011  . Migraine 10/07/2011  . Obesity, unspecified 10/07/2011  . Moderate obstructive sleep apnea 09/23/2011  . APL (antiphospholipid syndrome) (HCC) 11/05/2010  . Metabolic syndrome 11/04/2010  . Prediabetes 11/04/2010  . Prehypertension 11/04/2010  . Seasonal allergies 11/04/2010  . Vitamin D deficiency disease  11/04/2010  . Chronic asthma, mild persistent, uncomplicated 10/20/2010  . Polycystic ovarian syndrome 10/20/2010    Mellody Dance, SPT 09/01/2017, 6:53 PM  [place] [facility] PHYSICAL AND SPORTS MEDICINE [address], [place], [address] Phone: [phone]   Fax:  [phone]  Name: Amber Ochoa MRN: [id number] Date of Birth: 06/18/1980

## 2017-09-05 ENCOUNTER — Ambulatory Visit: Payer: Medicaid Other

## 2017-09-05 DIAGNOSIS — M6281 Muscle weakness (generalized): Secondary | ICD-10-CM | POA: Diagnosis not present

## 2017-09-05 DIAGNOSIS — M25562 Pain in left knee: Secondary | ICD-10-CM

## 2017-09-05 NOTE — Therapy (Addendum)
[place] [facility] [address] [place], [address] Phone: [phone]   Fax:  [phone]  Physical Therapy Treatment  Patient Details  Name: Amber Ochoa MRN: [id number] Date of Birth: [birth year] Referring Provider: Juanell Fairly MD   Encounter Date: 09/05/2017  PT End of Session - 09/05/17 1934    Visit Number  24    Number of Visits  29    Date for PT Re-Evaluation  09/08/17    Authorization Type  MEDICAID    PT Start Time  1845    PT Stop Time  1940    PT Time Calculation (min)  55 min    Activity Tolerance  Patient tolerated treatment well    Behavior During Therapy  [facility] for tasks assessed/performed       Past Medical History:  Diagnosis Date  . Asthma   . Blood clotting disorder (HCC)   . Carpal tunnel syndrome of right wrist 08/08/2013  . Chronic asthma, mild persistent, uncomplicated 10/20/2010  . Chronic pelvic pain in female 05/23/2012  . De Quervain's disease (radial styloid tenosynovitis) 05/10/2013  . Dysarthria 10/10/2014  . Endometriosis   . Ganglion cyst of wrist 04/26/2013  . Headache   . Heart murmur   . 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 Migraine 10/07/2011  . Moderate obstructive sleep apnea 09/23/2011   Overview:  June 15, 2014: SPLIT-NIGHT PSG @ [facility]: overall AHI 26.2/hr. The patient had CPAP applied. The maximal observed pressure was: 11 cm H2O. The recommended pressure is: 11-16 cm H2O.  CPAP set up on 08/28/14 by [facility]  . PCOS (polycystic ovarian syndrome)   . Polycystic ovarian syndrome 10/20/2010  . Seasonal allergies 11/04/2010  . Tachycardia   . Vitamin D deficiency disease 11/04/2010    Past Surgical History:  Procedure Laterality Date  . ABDOMINAL HYSTERECTOMY    . GALLBLADDER SURGERY    . Tendonitis Right     There were no vitals filed for this visit.  Subjective Assessment -  09/05/17 1929    Subjective  Patient reports that knee was sore for a day or two after previous treatment but that it is now feeling the best that it has felt in weeks. Pt also states that she walked a significant amount last week and had to wear her knee brace.     Pertinent History  History of LBP, Dequarivians, PCOS, clotting disorder, asthma     Limitations  House hold activities    Diagnostic tests  MRI: Increased swelling over the knee    Patient Stated Goals  Decrease pain, return to workout without modifications    Currently in Pain?  Yes    Pain Score  3     Pain Location  Knee    Pain Orientation  Left;Anterior    Pain Descriptors / Indicators  Sore    Pain Type  Acute pain    Pain Onset  More than a month ago    Pain Frequency  Constant       TREATMENT   Manual Therapy STM to quadriceps with focus on insertion to patella 15 min, improved tolerance and decreased pain with intervention compared to previous session with patient in long sitting Patella mobilizations, medial and lateral performed with patient in long sitting-- 30s x 3   Therapeutic Exercise SL TRX squats -- 5x3, improved depth tolerated today  SL deadlift with 10# weight 10x3   Lateral band walk with BTB around distal thigh -- 21mx4 verbal cues required for form Power step ups +calf raise 15x2  Gastroc stretch 30sx3 in standing  Soleus stretch 30sx3 in standing    Modalities ( ): with patient in long sitting Oneice pack placed along the L anterior knee, one large ice pack along lower leg with patient positioned in sitting to decrease pain and spasms within the musculature attaching to the L knee. High volt placed along the superior and inferior of the patella to decrease increased pain and spasms;twolarge pads on the gastrocnemius and hamstringon the L. Patient educated on treatment.  Patient demonstrates increased fatigue at the end of the session   PT Education - 09/05/17 1934    Education  provided  Yes    Education Details  Patient was educated on form and technique with therapeutic exercises and reeducated on gastroc and soleus stretch.    Person(s) Educated  Patient    Methods  Explanation;Demonstration    Comprehension  Verbalized understanding;Returned demonstration          PT Long Term Goals - 08/18/17 1826      PT LONG TERM GOAL #1   Title  Patient will be independent with HEP to continue benefits of therapy after discharge    Baseline  Heavy cues for Ther ex performance; 06/28/2017: requires cueing for exercise; 07/20/2017: Requires cueing for exercise mod; 08/18/2017: Independent with HEP    Time  6    Period  Weeks    Status  Achieved      PT LONG TERM GOAL #2   Title  Patient will have a worst pain of a 3/10 in the past week to indicate improvement at rest with knee pain.     Baseline  10/10 worst pain; 06/28/2017: 7/10; 07/20/2017: 6/10; 08/18/2017: 5/10    Time  6    Period  Weeks    Status  On-going      PT LONG TERM GOAL #3   Title  Patient will improve LEFS score to 80/80 to indicate improvement with running without increase in pain.     Baseline  74/80 LEFS; 06/28/2017: 72/80; 07/20/2017: 76/80; 08/18/2017: 77/80    Time  6    Period  Weeks    Status  On-going      PT LONG TERM GOAL #4   Title  Patient will be able to workout at her exercise class without increase in pain with activities.    Baseline  Increase in pain; 06/28/2017: Increase in pain with performance; 08/18/2017: no increase in pain with modifications    Time  6    Period  Weeks    Status  New            Plan - 09/05/17 1935    Clinical Impression Statement  Pt demonstrates improved tolerance to higher level exercises. Pt reports decreased pain after STM to quadriceps insertion onto patella. Overall, pts pain is slowly improving. Continue SL strengthening exercises focused on proper form and coordination. Pt was able to tolerate increased depth of TRX squat compared to previous session.  Patient will continue to benefit from skilled PT in order to decrease pain and return to PLOF.     History and Personal Factors relevant to plan of care:  Previous LBP, asthma    Clinical Presentation  Evolving    Clinical Presentation due to:  overall no significant improvements in pain    Clinical  Decision Making  Moderate    Rehab Potential  Fair    Clinical Impairments Affecting Rehab Potential  (+) highly motivation (-) 3 months of unrelenting pain    PT Frequency  2x / week    PT Duration  6 weeks    PT Treatment/Interventions  Electrical Stimulation;Iontophoresis 4mg /ml Dexamethasone;Cryotherapy;Moist Heat;Ultrasound;Stair training;Gait training;Therapeutic activities;Therapeutic exercise;Balance training;Neuromuscular re-education;Manual techniques;Patient/family education;Passive range of motion;Dry needling    PT Next Visit Plan  Progress note, single leg strengthening and coordination exercises    PT Home Exercise Plan  See patient education    Consulted and Agree with Plan of Care  Patient       Patient will benefit from skilled therapeutic intervention in order to improve the following deficits and impairments:  Abnormal gait, Decreased coordination, Decreased endurance, Decreased activity tolerance, Decreased range of motion, Decreased strength, Decreased balance, Difficulty walking, Hypomobility, Increased muscle spasms, Decreased knowledge of precautions  Visit Diagnosis: Muscle weakness (generalized)  Acute pain of left knee     Problem List Patient Active Problem List   Diagnosis Date Noted  . Dysarthria 10/10/2014  . 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 LLQ pain 11/07/2013  . Carpal tunnel syndrome of right wrist 08/08/2013  . De Quervain's disease (radial styloid tenosynovitis) 05/10/2013  . Ganglion cyst of wrist 04/26/2013  . Chronic pelvic pain in female 05/23/2012  . History of sinus tachycardia 10/07/2011  . Migraine 10/07/2011  .  Obesity, unspecified 10/07/2011  . Moderate obstructive sleep apnea 09/23/2011  . APL (antiphospholipid syndrome) (HCC) 11/05/2010  . Metabolic syndrome 11/04/2010  . Prediabetes 11/04/2010  . Prehypertension 11/04/2010  . Seasonal allergies 11/04/2010  . Vitamin D deficiency disease 11/04/2010  . Chronic asthma, mild persistent, uncomplicated 10/20/2010  . Polycystic ovarian syndrome 10/20/2010    Mellody DanceKaren Leilanny Fluitt, SPT 09/05/2017, 7:40 PM  [facility] [address] [place], [place], [address] Phone: [phone]   Fax:  [phone]  Name: Sarita HaverKrystal Star Huseby MRN: [id number] Date of Birth: 07/22/1980

## 2017-09-06 ENCOUNTER — Ambulatory Visit: Payer: Medicaid Other

## 2017-09-06 DIAGNOSIS — M6281 Muscle weakness (generalized): Secondary | ICD-10-CM | POA: Diagnosis not present

## 2017-09-06 DIAGNOSIS — M25562 Pain in left knee: Secondary | ICD-10-CM

## 2017-09-06 NOTE — Therapy (Signed)
[facility] [address] [place], [address] Phone: [phone]   Fax:  [phone]  Physical Therapy Treatment  Patient Details  Name: Amber Ochoa MRN: [id number] Date of Birth: 10-25-1980 Referring Provider: Juanell Fairly MD   Encounter Date: 09/06/2017  PT End of Session - 09/06/17 1732    Visit Number  25    Number of Visits  37    Date for PT Re-Evaluation  10/18/17    Authorization Type  MEDICAID    PT Start Time  1645    PT Stop Time  1745    PT Time Calculation (min)  60 min    Activity Tolerance  Patient tolerated treatment well    Behavior During Therapy  [facility] for tasks assessed/performed       Past Medical History:  Diagnosis Date  . Asthma   . Blood clotting disorder (HCC)   . Carpal tunnel syndrome of right wrist 08/08/2013  . Chronic asthma, mild persistent, uncomplicated 10/20/2010  . Chronic pelvic pain in female 05/23/2012  . De Quervain's disease (radial styloid tenosynovitis) 05/10/2013  . Dysarthria 10/10/2014  . Endometriosis   . Ganglion cyst of wrist 04/26/2013  . Headache   . Heart murmur   . 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 Migraine 10/07/2011  . Moderate obstructive sleep apnea 09/23/2011   Overview:  June 15, 2014: SPLIT-NIGHT PSG @ [facility]: overall AHI 26.2/hr. The patient had CPAP applied. The maximal observed pressure was: 11 cm H2O. The recommended pressure is: 11-16 cm H2O.  CPAP set up on 08/28/14 by [facility]  . PCOS (polycystic ovarian syndrome)   . Polycystic ovarian syndrome 10/20/2010  . Seasonal allergies 11/04/2010  . Tachycardia   . Vitamin D deficiency disease 11/04/2010    Past Surgical History:  Procedure Laterality Date  . ABDOMINAL HYSTERECTOMY    . GALLBLADDER SURGERY    . Tendonitis Right     There were no vitals filed for this visit.  Subjective Assessment -  09/06/17 1726    Subjective  Patient feels sore today and still is experiencing the same pain. Pt states that she feels that overal pain is improving but that it "just depends on the day".     Pertinent History  History of LBP, [place], PCOS, clotting disorder, asthma     Limitations  House hold activities    Diagnostic tests  MRI: Increased swelling over the knee    Patient Stated Goals  Decrease pain, return to workout without modifications    Currently in Pain?  Yes    Pain Score  6     Pain Location  Knee    Pain Orientation  Left    Pain Descriptors / Indicators  Sore    Pain Type  Acute pain    Pain Onset  More than a month ago    Pain Frequency  Constant          Manual Therapy STM to quadriceps with focus on insertion on patella in order to decrease muscle spasms and pain 15 min  Patella mobilization 4x30s Dry needling--(3) .25x placed along the L quadriceps to decrease pain within the musculature with patient in long sitting. Patient demonstrates slight decrease in pain after performance. Patient educated on risks of therapy and verbally consents to treatment.  Therapeutic Exercise Single leg bridges x15 (limited by  pain) Sidelying hip abduction B 10x2 (verbal and tactile cues required for proper form)  Modalities ( ): with patient in long sitting Oneice pack placed along the L anterior knee, one large ice pack along lower leg with patient positioned in sitting to decrease pain and spasms within the musculature attaching to the L knee. High volt placed along the superior and inferior of the patella to decrease increased pain and spasms;twolarge pads on the gastrocnemius and hamstringon the L. Patient educated on treatment.  Patient demonstrates increased fatigue at the end of session     PT Education - 09/06/17 1729    Education provided  Yes    Education Details  Patient educated on the need for rest from exercise class in order to allow tissue to  heal and decrease pain. Pt also educated on importance of decreasing high impact activities/running.    Person(s) Educated  Patient    Methods  Explanation    Comprehension  Verbalized understanding          PT Long Term Goals - 09/07/17 1236      PT LONG TERM GOAL #1   Title  Patient will be independent with HEP to continue benefits of therapy after discharge    Baseline  Heavy cues for Ther ex performance; 06/28/2017: requires cueing for exercise; 07/20/2017: Requires cueing for exercise mod; 08/18/2017: Independent with HEP    Time  6    Period  Weeks    Status  Achieved      PT LONG TERM GOAL #2   Title  Patient will have a worst pain of a 3/10 in the past week to indicate improvement at rest with knee pain.     Baseline  10/10 worst pain; 06/28/2017: 7/10; 07/20/2017: 6/10; 08/18/2017: 5/10; 09/06/2017: 6/10    Time  6    Period  Weeks    Status  On-going      PT LONG TERM GOAL #3   Title  Patient will improve LEFS score to 80/80 to indicate improvement with running without increase in pain.     Baseline  74/80 LEFS; 06/28/2017: 72/80; 07/20/2017: 76/80; 08/18/2017: 77/80; 09/06/2017:48/80    Time  6    Period  Weeks    Status  On-going      PT LONG TERM GOAL #4   Title  Patient will be able to workout at her exercise class without increase in pain with activities.    Baseline  Increase in pain; 06/28/2017: Increase in pain with performance; 08/18/2017: no increase in pain with modifications; 09/06/2017 pt reports having pain during exercise class     Time  6    Period  Weeks    Status  On-going            Plan - 09/07/17 1723    Clinical Impression Statement  Overall, pt is demonstrating improved tolerance to therapeutic exercises and baseline pain is improving slowly. Pt reports improved pain after STM and dry needling. Pt continues to demonstrate significant LE weakness and pain in L quad tendon. Although patient's LEFS has overall decreased in score, this does not indicate  function as patient reports that it was the first time 'she read the paper' with filling out the questions. Patient will benefit from continued skilled PT in order to decrease pain and return to prior level of function.    History and Personal Factors relevant to plan of care:  Previous LBP, asthma    Clinical Presentation  Evolving  Clinical Decision Making  Moderate    Rehab Potential  Fair    Clinical Impairments Affecting Rehab Potential  (+) highly motivation (-) 3 months of unrelenting pain    PT Frequency  2x / week    PT Duration  6 weeks    PT Treatment/Interventions  Electrical Stimulation;Iontophoresis 4mg /ml Dexamethasone;Cryotherapy;Moist Heat;Ultrasound;Stair training;Gait training;Therapeutic activities;Therapeutic exercise;Balance training;Neuromuscular re-education;Manual techniques;Patient/family education;Passive range of motion;Dry needling    PT Next Visit Plan  Add quad stretches and hip flexor stretches, SL strengthening, coordination exercises.    Consulted and Agree with Plan of Care  Patient       Patient will benefit from skilled therapeutic intervention in order to improve the following deficits and impairments:  Abnormal gait, Decreased coordination, Decreased endurance, Decreased activity tolerance, Decreased range of motion, Decreased strength, Decreased balance, Difficulty walking, Hypomobility, Increased muscle spasms, Decreased knowledge of precautions  Visit Diagnosis: Muscle weakness (generalized)  Acute pain of left knee     Problem List Patient Active Problem List   Diagnosis Date Noted  . Dysarthria 10/10/2014  . 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 LLQ pain 11/07/2013  . Carpal tunnel syndrome of right wrist 08/08/2013  . De Quervain's disease (radial styloid tenosynovitis) 05/10/2013  . Ganglion cyst of wrist 04/26/2013  . Chronic pelvic pain in female 05/23/2012  . History of sinus tachycardia 10/07/2011  . Migraine  10/07/2011  . Obesity, unspecified 10/07/2011  . Moderate obstructive sleep apnea 09/23/2011  . APL (antiphospholipid syndrome) (HCC) 11/05/2010  . Metabolic syndrome 11/04/2010  . Prediabetes 11/04/2010  . Prehypertension 11/04/2010  . Seasonal allergies 11/04/2010  . Vitamin D deficiency disease 11/04/2010  . Chronic asthma, mild persistent, uncomplicated 10/20/2010  . Polycystic ovarian syndrome 10/20/2010    Mellody DanceKaren Jabree Pernice, SPT 09/07/2017, 6:29 PM  [facility] [address] [place], [place], [address] Phone: [phone]   Fax:  [phone]  Name: Sarita HaverKrystal Star Meske MRN: [id number] Date of Birth: 04/23/1980

## 2017-09-14 ENCOUNTER — Other Ambulatory Visit: Payer: Self-pay

## 2017-09-14 ENCOUNTER — Encounter: Payer: Self-pay | Admitting: Emergency Medicine

## 2017-09-14 ENCOUNTER — Emergency Department
Admission: EM | Admit: 2017-09-14 | Discharge: 2017-09-14 | Disposition: A | Discharge status: Home / Self Care | Payer: Medicaid Other | Attending: Student in an Organized Health Care Education/Training Program | Admitting: Student in an Organized Health Care Education/Training Program

## 2017-09-14 ENCOUNTER — Emergency Department: Payer: Medicaid Other

## 2017-09-14 DIAGNOSIS — E119 Type 2 diabetes mellitus without complications: Secondary | ICD-10-CM | POA: Diagnosis not present

## 2017-09-14 DIAGNOSIS — R0789 Other chest pain: Secondary | ICD-10-CM | POA: Diagnosis not present

## 2017-09-14 DIAGNOSIS — Z79899 Other long term (current) drug therapy: Secondary | ICD-10-CM | POA: Insufficient documentation

## 2017-09-14 DIAGNOSIS — Z7982 Long term (current) use of aspirin: Secondary | ICD-10-CM | POA: Diagnosis not present

## 2017-09-14 DIAGNOSIS — J45909 Unspecified asthma, uncomplicated: Secondary | ICD-10-CM | POA: Diagnosis not present

## 2017-09-14 DIAGNOSIS — M6281 Muscle weakness (generalized): Secondary | ICD-10-CM | POA: Diagnosis not present

## 2017-09-14 DIAGNOSIS — R0602 Shortness of breath: Secondary | ICD-10-CM | POA: Diagnosis present

## 2017-09-14 HISTORY — DX: Type 2 diabetes mellitus without complications: E11.9

## 2017-09-14 LAB — BASIC METABOLIC PANEL
Anion gap: 8 (ref 5–15)
BUN: 18 mg/dL (ref 6–20)
CALCIUM: 9 mg/dL (ref 8.9–10.3)
CHLORIDE: 104 mmol/L (ref 101–111)
CO2: 24 mmol/L (ref 22–32)
Creatinine, Ser: 0.81 mg/dL (ref 0.44–1.00)
GFR calc Af Amer: >60 mL/min (ref >60)
GFR calc non Af Amer: >60 mL/min (ref >60)
Glucose, Bld: 95 mg/dL (ref 65–99)
Potassium: 3.9 mmol/L (ref 3.5–5.1)
Sodium: 136 mmol/L (ref 135–145)

## 2017-09-14 LAB — CBC
HCT: 37.3 % (ref 35.0–47.0)
Hemoglobin: 12.6 g/dL (ref 12.0–16.0)
MCH: 29.3 pg (ref 26.0–34.0)
MCHC: 33.8 g/dL (ref 32.0–36.0)
MCV: 86.6 fL (ref 80.0–100.0)
PLATELETS: 257 10*3/uL (ref 150–440)
RBC: 4.31 MIL/uL (ref 3.80–5.20)
RDW: 13.7 % (ref 11.5–14.5)
WBC: 9.2 10*3/uL (ref 3.6–11.0)

## 2017-09-14 LAB — FIBRIN DERIVATIVES D-DIMER (ARMC ONLY): Fibrin derivatives D-dimer (ARMC): 248.02 ng/mL (FEU) (ref 0.00–499.00)

## 2017-09-14 LAB — TROPONIN I: Troponin I: <0.03 ng/mL (ref <0.03)

## 2017-09-14 MED ORDER — IPRATROPIUM-ALBUTEROL 0.5-2.5 (3) MG/3ML IN SOLN
3.0000 mL | Freq: Once | RESPIRATORY_TRACT | Status: AC
  Administered 2017-09-14: 3 mL via RESPIRATORY_TRACT
  Filled 2017-09-14: qty 3

## 2017-09-14 MED ORDER — ALBUTEROL SULFATE HFA 108 (90 BASE) MCG/ACT IN AERS: 2.0000 | INHALATION_SPRAY | Freq: Four times a day (QID) | RESPIRATORY_TRACT | 2 refills | Status: AC | PRN

## 2017-09-14 MED ORDER — PREDNISONE 20 MG PO TABS
60.0000 mg | ORAL_TABLET | Freq: Once | ORAL | Status: AC
  Administered 2017-09-14: 60 mg via ORAL
  Filled 2017-09-14: qty 3

## 2017-09-14 MED ORDER — IOPAMIDOL (ISOVUE-370) INJECTION 76%
75.0000 mL | Freq: Once | INTRAVENOUS | Status: DC | PRN
  Filled 2017-09-14: qty 75

## 2017-09-14 MED ORDER — PREDNISONE 20 MG PO TABS: 40.0000 mg | ORAL_TABLET | Freq: Every day | ORAL | 0 refills | Status: AC

## 2017-09-14 MED ORDER — ACETAMINOPHEN 500 MG PO TABS
1000.0000 mg | ORAL_TABLET | Freq: Once | ORAL | Status: AC
  Administered 2017-09-14: 1000 mg via ORAL
  Filled 2017-09-14: qty 2

## 2017-09-14 MED ORDER — KETOROLAC TROMETHAMINE 30 MG/ML IJ SOLN
15.0000 mg | Freq: Once | INTRAMUSCULAR | Status: AC
  Administered 2017-09-14: 15 mg via INTRAVENOUS
  Filled 2017-09-14: qty 1

## 2017-09-14 NOTE — ED Triage Notes (Signed)
Patient ambulatory to triage with steady gait, without difficulty or distress noted; pt reports mid upper CP radiating to left side [facility]; denies hx of same

## 2017-09-14 NOTE — ED Notes (Addendum)
CT at bedside. Dr. Roxan Hockeyobinson asked CT to hold off until he evaluates pt. Will call CT once pt is assessed.

## 2017-09-14 NOTE — ED Provider Notes (Signed)
[facility] Emergency Department Provider Note    First MD Initiated Contact with Patient 09/14/17 2028     (approximate)  I have reviewed the triage vital signs and the nursing notes.   HISTORY  Chief Complaint Chest Pain    HPI Amber Ochoa is a 37 y.o. female the history of asthma as well as sleep apnea, PCOS as well as protein S deficiency on daily aspirin presents to the ER with shortness of breath and left-sided chest pain.  Feels that she never had symptoms like this before.  Denies any history of blood clots.  Denies any prolonged travel.  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  States it does hurt to take a deep breath and feels some short of breath.  Denies any fevers.  No cough.  No lower extremity swelling.   Denies any significant discomfort at this time.  States the pain is primarily on the left side of her chest and somewhat reproducible with palpation.  She any birth control use and is that is post hysterectomy.   Past Medical History:  Diagnosis Date  . Asthma   . Blood clotting disorder (HCC)   . Carpal tunnel syndrome of right wrist 08/08/2013  . Chronic asthma, mild persistent, uncomplicated 10/20/2010  . Chronic pelvic pain in female 05/23/2012  . De Quervain's disease (radial styloid tenosynovitis) 05/10/2013  . Diabetes mellitus without complication (HCC)   . Dysarthria 10/10/2014  . Endometriosis   . Ganglion cyst of wrist 04/26/2013  . Headache   . Heart murmur   . 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 Migraine 10/07/2011  . Moderate obstructive sleep apnea 09/23/2011   Overview:  June 15, 2014: SPLIT-NIGHT PSG @ [facility]: overall AHI 26.2/hr. The patient had CPAP applied. The maximal observed pressure was: 11 cm H2O. The recommended pressure is: 11-16 cm H2O.  CPAP set up on 08/28/14 by [facility]  . PCOS (polycystic  ovarian syndrome)   . Polycystic ovarian syndrome 10/20/2010  . Seasonal allergies 11/04/2010  . Tachycardia   . Vitamin D deficiency disease 11/04/2010   No family history on file. Past Surgical History:  Procedure Laterality Date  . ABDOMINAL HYSTERECTOMY    . GALLBLADDER SURGERY    . Tendonitis Right    Patient Active Problem List   Diagnosis Date Noted  . Dysarthria 10/10/2014  . 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 LLQ pain 11/07/2013  . Carpal tunnel syndrome of right wrist 08/08/2013  . De Quervain's disease (radial styloid tenosynovitis) 05/10/2013  . Ganglion cyst of wrist 04/26/2013  . Chronic pelvic pain in female 05/23/2012  . History of sinus tachycardia 10/07/2011  . Migraine 10/07/2011  . Obesity, unspecified 10/07/2011  . Moderate obstructive sleep apnea 09/23/2011  . APL (antiphospholipid syndrome) (HCC) 11/05/2010  . Metabolic syndrome 11/04/2010  . Prediabetes 11/04/2010  . Prehypertension 11/04/2010  . Seasonal allergies 11/04/2010  . Vitamin D deficiency disease 11/04/2010  . Chronic asthma, mild persistent, uncomplicated 10/20/2010  . Polycystic ovarian syndrome 10/20/2010      Prior to Admission medications   Medication Sig Start Date End Date Taking? Authorizing Provider  albuterol (PROVENTIL HFA;VENTOLIN HFA) 108 (90 BASE) MCG/ACT inhaler Inhale into the lungs every 6 (six) hours as needed for wheezing or shortness of breath.    [provider]  albuterol (PROVENTIL  HFA;VENTOLIN HFA) 108 (90 Base) MCG/ACT inhaler Inhale 2 puffs into the lungs every 6 (six) hours as needed for wheezing or shortness of breath. 09/14/17   Willy Eddy, MD  aspirin EC 81 MG tablet Take 81 mg by mouth daily.    [provider]  gabapentin (NEURONTIN) 400 MG capsule Take 400 mg by mouth at bedtime.    [provider]  omeprazole (PRILOSEC) 20 MG capsule Take by mouth. 12/09/15 12/08/16  [provider]  ondansetron  (ZOFRAN ODT) 4 MG disintegrating tablet Take 1 tablet (4 mg total) by mouth every 8 (eight) hours as needed for nausea or vomiting. Patient not taking: Reported on 05/12/2017 11/16/16   Rebecka Apley, MD  predniSONE (DELTASONE) 20 MG tablet Take 2 tablets (40 mg total) by mouth daily for 5 days. 09/14/17 09/19/17  Willy Eddy, MD  valACYclovir (VALTREX) 1000 MG tablet Take by mouth. 10/18/14   [provider]    Allergies Morphine; Morphine and related; Sulfa antibiotics; Other; Sudafed [pseudoephedrine hcl]; and Sulfamethoxazole-trimethoprim    Social History Social History   Tobacco Use  . Smoking status: Never Smoker  . Smokeless tobacco: Never Used  Substance Use Topics  . Alcohol use: No  . Drug use: No    Review of Systems Patient denies headaches, rhinorrhea, blurry vision, numbness, shortness of breath, chest pain, edema, cough, abdominal pain, nausea, vomiting, diarrhea, dysuria, fevers, rashes or hallucinations unless otherwise stated above in HPI. ____________________________________________   PHYSICAL EXAM:  VITAL SIGNS: Vitals:   09/14/17 1947  BP: (!) 150/83  Pulse: (!) 101  Resp: 20  Temp: 98 F (36.7 C)  SpO2: 99%    Constitutional: Alert and oriented.  Eyes: Conjunctivae are normal.  Head: Atraumatic. Nose: No congestion/rhinnorhea. Mouth/Throat: Mucous membranes are moist.   Neck: No stridor. Painless ROM.  Cardiovascular: Normal rate, regular rhythm. Grossly normal heart sounds.  Good peripheral circulation. Respiratory: Normal respiratory effort.  No retractions. Lungs with coarse bibasilar breath sounds with faint expiratory wheeze,  Gastrointestinal: Soft and nontender. No distention. No abdominal bruits. No CVA tenderness. Genitourinary:  Musculoskeletal: there is pain reproducible with palpation of left anterior chest wall.  No lower extremity tenderness nor edema.  No joint effusions. Neurologic:  Normal speech and language.  No gross focal neurologic deficits are appreciated. No facial droop Skin:  Skin is warm, dry and intact. No rash noted. Psychiatric: Mood and affect are normal. Speech and behavior are normal.  ____________________________________________   LABS (all labs ordered are listed, but only abnormal results are displayed)  Results for orders placed or performed during the hospital encounter of 09/14/17 (from the past 24 hour(s))  Basic metabolic panel     Status: None   Collection Time: 09/14/17  7:52 PM  Result Value Ref Range   Sodium 136 135 - 145 mmol/L   Potassium 3.9 3.5 - 5.1 mmol/L   Chloride 104 101 - 111 mmol/L   CO2 24 22 - 32 mmol/L   Glucose, Bld 95 65 - 99 mg/dL   BUN 18 6 - 20 mg/dL   Creatinine, Ser 9.60 0.44 - 1.00 mg/dL   Calcium 9.0 8.9 - 45.4 mg/dL   GFR calc non Af Amer >60 >60 mL/min   GFR calc Af Amer >60 >60 mL/min   Anion gap 8 5 - 15  CBC     Status: None   Collection Time: 09/14/17  7:52 PM  Result Value Ref Range   WBC 9.2 3.6 - 11.0  K/uL   RBC 4.31 3.80 - 5.20 MIL/uL   Hemoglobin 12.6 12.0 - 16.0 g/dL   HCT 16.1 09.6 - 04.5 %   MCV 86.6 80.0 - 100.0 fL   MCH 29.3 26.0 - 34.0 pg   MCHC 33.8 32.0 - 36.0 g/dL   RDW 40.9 81.1 - 91.4 %   Platelets 257 150 - 440 K/uL  Troponin I     Status: None   Collection Time: 09/14/17  7:52 PM  Result Value Ref Range   Troponin I <0.03 <0.03 ng/mL  Fibrin derivatives D-Dimer ([facility] only)     Status: None   Collection Time: 09/14/17  8:53 PM  Result Value Ref Range   Fibrin derivatives D-dimer ([facility]) 248.02 0.00 - 499.00 ng/mL (FEU)   ____________________________________________  EKG My review and personal interpretation at Time: 19:44   Indication: chest pain  Rate: 105  Rhythm: sinus Axis: normal Other: normal intervals, no stemi, no right heart strain ____________________________________________  RADIOLOGY  I personally reviewed all radiographic images ordered to evaluate for the above acute complaints and  reviewed radiology reports and findings.  These findings were personally discussed with the patient.  Please see medical record for radiology report.  ____________________________________________   PROCEDURES  Procedure(s) performed:  Procedures    Critical Care performed: no ____________________________________________   INITIAL IMPRESSION / ASSESSMENT AND PLAN / ED COURSE  Pertinent labs & imaging results that were available during my care of the patient were reviewed by me and considered in my medical decision making (see chart for details).   DDX: ACS, pericarditis, esophagitis, boerhaaves, pe, dissection, pna, bronchitis, costochondritis   Amber Ochoa is a 37 y.o. who presents to the ED with symptoms as described above.  She is afebrile.  She is borderline tachycardic but normotensive.  She has no respiratory distress and no hypoxia.  Patient is low risk by heart score of 1 versus 2 based on subjectivity.  EKG does not show evidence of acute ischemia and her initial troponin over 6 hours after onset of pain.  Not clinically consistent with ACS.  Not clinically consistent with dissection.  No pain radiating through to her back.  Does have some wheeze and coarse breath sounds and given her history of asthma I do suspect some component of bronchitis.  Patient is low risk by PE.  Therefore a d-dimer was sent to further risk stratify.  Her d-dimer today is normal at 200.  She is never had a previously diagnosed DVT or PE but does take aspirin.  She has no immediate recent risk factors for DVT or PE.  Patient with some improvement after albuterol and given her reproducibility of pain I do suspect some component of bronchitis particularly given her history of asthma with wheeze.  I recommend we trial treatment of bronchitis.  Did also discussed possibility and concern for PE/DVT but in the absence of elevated d-dimer its exceedingly unlikely.  Patient agrees with plan and would like to  hold off on any further diagnostic testing she currently denies any chest pain or shortness of breath.  Will trial treatment for bronchitis denies of exacerbation with bronchodilators and steroids.  Informed patient that she should return to the ER in 12 to 24 hours if symptoms not improved or sooner if symptoms worsen.  Have discussed with the patient and available family all diagnostics and treatments performed thus far and all questions were answered to the best of my ability. The patient demonstrates understanding and agreement with  plan.       As part of my medical decision making, I reviewed the following data within the electronic MEDICAL RECORD NUMBER Nursing notes reviewed and incorporated, Labs reviewed, notes from prior ED visits.   ____________________________________________   FINAL CLINICAL IMPRESSION(S) / ED DIAGNOSES  Final diagnoses:  Chest wall pain      NEW MEDICATIONS STARTED DURING THIS VISIT:  New Prescriptions   ALBUTEROL (PROVENTIL HFA;VENTOLIN HFA) 108 (90 BASE) MCG/ACT INHALER    Inhale 2 puffs into the lungs every 6 (six) hours as needed for wheezing or shortness of breath.   PREDNISONE (DELTASONE) 20 MG TABLET    Take 2 tablets (40 mg total) by mouth daily for 5 days.     Note:  This document was prepared using Dragon voice recognition software and may include unintentional dictation errors.    Willy Eddyobinson, Asbury Hair, MD 09/14/17 2232

## 2017-09-14 NOTE — ED Notes (Signed)
Pt ambulatory to POV without difficulty. VSS. NAD. Discharge instructions, RX and follow up discussed. All questions and concerns addressed.  

## 2017-09-28 ENCOUNTER — Ambulatory Visit: Payer: Medicaid Other | Attending: Orthopedic Surgery

## 2017-09-28 DIAGNOSIS — M6281 Muscle weakness (generalized): Secondary | ICD-10-CM

## 2017-09-28 DIAGNOSIS — M25562 Pain in left knee: Secondary | ICD-10-CM | POA: Diagnosis not present

## 2017-09-28 NOTE — Therapy (Signed)
[facility] [address] [place], [address] Phone: [phone]   Fax:  [phone]  Physical Therapy Treatment  Patient Details  Name: Amber Ochoa MRN: [id number] Date of Birth: 24-Jan-1981 Referring Provider: Juanell Fairly MD   Encounter Date: 09/28/2017  PT End of Session - 09/28/17 1612    Visit Number  26    Number of Visits  37    Date for PT Re-Evaluation  10/18/17    Authorization Type  MEDICAID    PT Start Time  1345    PT Stop Time  1440    PT Time Calculation (min)  55 min    Activity Tolerance  Patient tolerated treatment well    Behavior During Therapy  [facility] for tasks assessed/performed       Past Medical History:  Diagnosis Date  . Asthma   . Blood clotting disorder (HCC)   . Carpal tunnel syndrome of right wrist 08/08/2013  . Chronic asthma, mild persistent, uncomplicated 10/20/2010  . Chronic pelvic pain in female 05/23/2012  . De Quervain's disease (radial styloid tenosynovitis) 05/10/2013  . Diabetes mellitus without complication (HCC)   . Dysarthria 10/10/2014  . Endometriosis   . Ganglion cyst of wrist 04/26/2013  . Headache   . Heart murmur   . 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 Migraine 10/07/2011  . Moderate obstructive sleep apnea 09/23/2011   Overview:  June 15, 2014: SPLIT-NIGHT PSG @ [facility]: overall AHI 26.2/hr. The patient had CPAP applied. The maximal observed pressure was: 11 cm H2O. The recommended pressure is: 11-16 cm H2O.  CPAP set up on 08/28/14 by [facility]  . PCOS (polycystic ovarian syndrome)   . Polycystic ovarian syndrome 10/20/2010  . Seasonal allergies 11/04/2010  . Tachycardia   . Vitamin D deficiency disease 11/04/2010    Past Surgical History:  Procedure Laterality Date  . ABDOMINAL HYSTERECTOMY    . GALLBLADDER SURGERY    . Tendonitis Right     There were no vitals  filed for this visit.  Subjective Assessment - 09/28/17 1608    Subjective  Pt reports anterior knee pain currently 3/10. Pt stated that she had an asthma attack 2 weeks ago and has decreased workouts to 3x/week and feels that this has helped with knee pain. Pt stated that she has some days with no knee pain at all. Pt saw MD today and pt stated that MD wants to do another cortisone shot at the end of august and potentially arthroscopic debridement if  she expereiences no relief from cortiosne injection.     Pertinent History  History of LBP, Dequarivians, PCOS, clotting disorder, asthma     Limitations  House hold activities    Diagnostic tests  MRI: Increased swelling over the knee    Patient Stated Goals  Decrease pain, return to workout without modifications    Currently in Pain?  Yes    Pain Score  3     Pain Location  Knee    Pain Orientation  Left    Pain Descriptors / Indicators  Aching    Pain Type  Acute pain    Pain Onset  More than a month ago    Pain Frequency  Constant        TREATMENT Manual Therapy STM to quad tendon and quad muscles x20 min to decrease pain and muscle  spasms Patella mobilizations with patient in long sitting (superior, inferior, medial and lateral) 30s x7  Therapeutic Exercise Squats to elevated surface x10 verbal cues for proper form Heel taps B x15, half foam roll used as external cue to prevent translation of knee above toes Calf stretch (gastroc and soleus) 30sx4 Prone rectus femoris stretch with belt 30s x2 pt educated on how to perform exercise at home, added to HEP  Modalities ( ): with patient in long sitting Oneice pack placed along the L anterior knee, one large ice pack along lower leg with patient positioned in sitting to decrease pain and spasms within the musculature attaching to the L knee. High volt placed along the superior and inferior of the patella to decrease increased pain and spasms;twolarge pads on the gastrocnemius and  hamstringon the L. Patient educated on treatment.  Patient demonstrates decreased pain at the end of the session    PT Education - 09/28/17 1611    Education Details  Patient educated on benefits of taking days off of intense exercises classes and how this has potentially led to an improvement in knee symptoms. Pt also educated on proper form and technique for therapeutic exercises.    Person(s) Educated  Patient    Methods  Explanation    Comprehension  Verbalized understanding          PT Long Term Goals - 09/07/17 1236      PT LONG TERM GOAL #1   Title  Patient will be independent with HEP to continue benefits of therapy after discharge    Baseline  Heavy cues for Ther ex performance; 06/28/2017: requires cueing for exercise; 07/20/2017: Requires cueing for exercise mod; 08/18/2017: Independent with HEP    Time  6    Period  Weeks    Status  Achieved      PT LONG TERM GOAL #2   Title  Patient will have a worst pain of a 3/10 in the past week to indicate improvement at rest with knee pain.     Baseline  10/10 worst pain; 06/28/2017: 7/10; 07/20/2017: 6/10; 08/18/2017: 5/10; 09/06/2017: 6/10    Time  6    Period  Weeks    Status  On-going      PT LONG TERM GOAL #3   Title  Patient will improve LEFS score to 80/80 to indicate improvement with running without increase in pain.     Baseline  74/80 LEFS; 06/28/2017: 72/80; 07/20/2017: 76/80; 08/18/2017: 77/80; 09/06/2017:48/80    Time  6    Period  Weeks    Status  On-going      PT LONG TERM GOAL #4   Title  Patient will be able to workout at her exercise class without increase in pain with activities.    Baseline  Increase in pain; 06/28/2017: Increase in pain with performance; 08/18/2017: no increase in pain with modifications; 09/06/2017 pt reports having pain during exercise class     Time  6    Period  Weeks    Status  On-going            Plan - 09/28/17 1613    Clinical Impression Statement  Patient presented with  significant tenderness to palpation along quadriceps tendon and medial quadriceps and pain decreased after STM. Pt continues to demonstrate significant LE weakness indicated by her inability to perform full squat with proper form and without pain. Pt reqires verbal and tactile cueing to prevent fwd translation of knee over toe with squats and  heel tap exercises. Pt will continue to benefit from skilled PT in order to return to prior level of function.     Rehab Potential  Fair    Clinical Impairments Affecting Rehab Potential  (+) highly motivation (-) 3 months of unrelenting pain    PT Frequency  2x / week    PT Duration  6 weeks    PT Treatment/Interventions  Electrical Stimulation;Iontophoresis 4mg /ml Dexamethasone;Cryotherapy;Moist Heat;Ultrasound;Stair training;Gait training;Therapeutic activities;Therapeutic exercise;Balance training;Neuromuscular re-education;Manual techniques;Patient/family education;Passive range of motion;Dry needling    PT Next Visit Plan  review prone quad stretch, continue with SL strengthening in pain free range     PT Home Exercise Plan  See patient education    Consulted and Agree with Plan of Care  Patient       Patient will benefit from skilled therapeutic intervention in order to improve the following deficits and impairments:  Abnormal gait, Decreased coordination, Decreased endurance, Decreased activity tolerance, Decreased range of motion, Decreased strength, Decreased balance, Difficulty walking, Hypomobility, Increased muscle spasms, Decreased knowledge of precautions  Visit Diagnosis: Acute pain of left knee  Muscle weakness (generalized)     Problem List Patient Active Problem List   Diagnosis Date Noted  . Dysarthria 10/10/2014  . 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 LLQ pain 11/07/2013  . Carpal tunnel syndrome of right wrist 08/08/2013  . De Quervain's disease (radial styloid tenosynovitis) 05/10/2013  . Ganglion cyst of  wrist 04/26/2013  . Chronic pelvic pain in female 05/23/2012  . History of sinus tachycardia 10/07/2011  . Migraine 10/07/2011  . Obesity, unspecified 10/07/2011  . Moderate obstructive sleep apnea 09/23/2011  . APL (antiphospholipid syndrome) (HCC) 11/05/2010  . Metabolic syndrome 11/04/2010  . Prediabetes 11/04/2010  . Prehypertension 11/04/2010  . Seasonal allergies 11/04/2010  . Vitamin D deficiency disease 11/04/2010  . Chronic asthma, mild persistent, uncomplicated 10/20/2010  . Polycystic ovarian syndrome 10/20/2010    Mellody DanceKaren Aiya Keach, SPT 09/28/2017, 4:20 PM  [facility] [address] [place], [place], [address] Phone: [phone]   Fax:  [phone]  Name: Amber Ochoa MRN: [id number] Date of Birth: 10/30/1980

## 2017-10-03 ENCOUNTER — Ambulatory Visit: Payer: Medicaid Other

## 2017-10-03 DIAGNOSIS — M6281 Muscle weakness (generalized): Secondary | ICD-10-CM

## 2017-10-03 DIAGNOSIS — M25562 Pain in left knee: Secondary | ICD-10-CM

## 2017-10-03 NOTE — Therapy (Signed)
[facility] [address], [place], [address] Phone: [phone]   Fax:  [phone]  Physical Therapy Treatment  Patient Details  Name: Amber Ochoa MRN: [id number] Date of Birth: 06-Oct-1980 Referring Provider: Juanell Fairly MD   Encounter Date: 10/03/2017  PT End of Session - 10/03/17 1807    Visit Number  27    Number of Visits  37    Date for PT Re-Evaluation  10/18/17    Authorization Type  2/4 MEDICAID    PT Start Time  1710    PT Stop Time  1805    PT Time Calculation (min)  55 min    Activity Tolerance  Patient tolerated treatment well    Behavior During Therapy  [facility] for tasks assessed/performed       Past Medical History:  Diagnosis Date  . Asthma   . Blood clotting disorder (HCC)   . Carpal tunnel syndrome of right wrist 08/08/2013  . Chronic asthma, mild persistent, uncomplicated 10/20/2010  . Chronic pelvic pain in female 05/23/2012  . De Quervain's disease (radial styloid tenosynovitis) 05/10/2013  . Diabetes mellitus without complication (HCC)   . Dysarthria 10/10/2014  . Endometriosis   . Ganglion cyst of wrist 04/26/2013  . Headache   . Heart murmur   . 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 Migraine 10/07/2011  . Moderate obstructive sleep apnea 09/23/2011   Overview:  June 15, 2014: SPLIT-NIGHT PSG @ [facility]: overall AHI 26.2/hr. The patient had CPAP applied. The maximal observed pressure was: 11 cm H2O. The recommended pressure is: 11-16 cm H2O.  CPAP set up on 08/28/14 by [facility]  . PCOS (polycystic ovarian syndrome)   . Polycystic ovarian syndrome 10/20/2010  . Seasonal allergies 11/04/2010  . Tachycardia   . Vitamin D deficiency disease 11/04/2010    Past Surgical History:  Procedure Laterality Date  . ABDOMINAL HYSTERECTOMY    . GALLBLADDER SURGERY    . Tendonitis Right     There were no vitals  filed for this visit.  Subjective Assessment - 10/03/17 1804    Subjective  Pt reports that knee has been feeling ok but is aggravated by wearing non supportive shoes on hard ground all day. Pt has decreased overall attendance at gym classes.     Pertinent History  History of LBP, Dequarivians, PCOS, clotting disorder, asthma     Limitations  House hold activities    Diagnostic tests  MRI: Increased swelling over the knee    Patient Stated Goals  Decrease pain, return to workout without modifications    Currently in Pain?  Yes    Pain Score  3     Pain Location  Knee    Pain Orientation  Left    Pain Descriptors / Indicators  Aching    Pain Type  Acute pain    Pain Onset  More than a month ago    Pain Frequency  Constant           TREATMENT Manual Therapy STM to quad tendon and quad muscles x20 min to decrease pain and muscle spasms Patella mobilizations with patient in long sitting (superior, inferior, medial and lateral) 30s x7  Therapeutic Exercise Squats to elevated surface x10  decr verbal cues required for proper form Prone rectus femoris stretch with belt 30s x10 pt  Hip abduction on machine 55#  x20 B TRX squats 10x2   Modalities ( ): with patient in long sitting Oneice pack placed along the L anterior knee, one large ice pack along lower leg with patient positioned in sitting to decrease pain and spasms within the musculature attaching to the L knee. High volt placed along the superior and inferior of the patella to decrease increased pain and spasms;twolarge pads on the gastrocnemius and hamstringon the L. Patient educated on treatment.  Patient demonstrates decreased pain at the end of the session    PT Education - 10/03/17 1806    Education provided  Yes    Education Details  Patient educated on proper form and technique for therapeutic exercises.     Person(s) Educated  Patient    Methods  Explanation    Comprehension  Verbalized understanding           PT Long Term Goals - 09/07/17 1236      PT LONG TERM GOAL #1   Title  Patient will be independent with HEP to continue benefits of therapy after discharge    Baseline  Heavy cues for Ther ex performance; 06/28/2017: requires cueing for exercise; 07/20/2017: Requires cueing for exercise mod; 08/18/2017: Independent with HEP    Time  6    Period  Weeks    Status  Achieved      PT LONG TERM GOAL #2   Title  Patient will have a worst pain of a 3/10 in the past week to indicate improvement at rest with knee pain.     Baseline  10/10 worst pain; 06/28/2017: 7/10; 07/20/2017: 6/10; 08/18/2017: 5/10; 09/06/2017: 6/10    Time  6    Period  Weeks    Status  On-going      PT LONG TERM GOAL #3   Title  Patient will improve LEFS score to 80/80 to indicate improvement with running without increase in pain.     Baseline  74/80 LEFS; 06/28/2017: 72/80; 07/20/2017: 76/80; 08/18/2017: 77/80; 09/06/2017:48/80    Time  6    Period  Weeks    Status  On-going      PT LONG TERM GOAL #4   Title  Patient will be able to workout at her exercise class without increase in pain with activities.    Baseline  Increase in pain; 06/28/2017: Increase in pain with performance; 08/18/2017: no increase in pain with modifications; 09/06/2017 pt reports having pain during exercise class     Time  6    Period  Weeks    Status  On-going            Plan - 10/03/17 1809    Clinical Impression Statement  Patient presents with decreased tenderness to palpation along quadriceps musculature, quad tendon and patella tendon. Pt requires improved tolerance to squat exercises today and required less cueing for proper form and technique. Pt wiill benefit from continued skilled PT in order to return to prior level of function.    Rehab Potential  Fair    Clinical Impairments Affecting Rehab Potential  (+) highly motivation (-) 3 months of unrelenting pain    PT Frequency  2x / week    PT Duration  6 weeks    PT  Treatment/Interventions  Electrical Stimulation;Iontophoresis 4mg /ml Dexamethasone;Cryotherapy;Moist Heat;Ultrasound;Stair training;Gait training;Therapeutic activities;Therapeutic exercise;Balance training;Neuromuscular re-education;Manual techniques;Patient/family education;Passive range of motion;Dry needling    PT Next Visit Plan  Provide HEP, review prone quad stretch    PT Home Exercise Plan  See patient education  Consulted and Agree with Plan of Care  Patient       Patient will benefit from skilled therapeutic intervention in order to improve the following deficits and impairments:  Abnormal gait, Decreased coordination, Decreased endurance, Decreased activity tolerance, Decreased range of motion, Decreased strength, Decreased balance, Difficulty walking, Hypomobility, Increased muscle spasms, Decreased knowledge of precautions  Visit Diagnosis: Acute pain of left knee  Muscle weakness (generalized)     Problem List Patient Active Problem List   Diagnosis Date Noted  . Dysarthria 10/10/2014  . 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 LLQ pain 11/07/2013  . Carpal tunnel syndrome of right wrist 08/08/2013  . De Quervain's disease (radial styloid tenosynovitis) 05/10/2013  . Ganglion cyst of wrist 04/26/2013  . Chronic pelvic pain in female 05/23/2012  . History of sinus tachycardia 10/07/2011  . Migraine 10/07/2011  . Obesity, unspecified 10/07/2011  . Moderate obstructive sleep apnea 09/23/2011  . APL (antiphospholipid syndrome) (HCC) 11/05/2010  . Metabolic syndrome 11/04/2010  . Prediabetes 11/04/2010  . Prehypertension 11/04/2010  . Seasonal allergies 11/04/2010  . Vitamin D deficiency disease 11/04/2010  . Chronic asthma, mild persistent, uncomplicated 10/20/2010  . Polycystic ovarian syndrome 10/20/2010    Mellody DanceKaren Becki Mccaskill, SPT 10/03/2017, 7:12 PM  [facility] [address], [place], [address] Phone: [phone]   Fax:  [phone]  Name: Amber Ochoa MRN: [id number] Date of Birth: 12/18/1980

## 2017-10-11 ENCOUNTER — Ambulatory Visit: Payer: Medicaid Other

## 2017-10-11 DIAGNOSIS — M25562 Pain in left knee: Secondary | ICD-10-CM

## 2017-10-11 DIAGNOSIS — M6281 Muscle weakness (generalized): Secondary | ICD-10-CM

## 2017-10-11 NOTE — Therapy (Signed)
[facility] PH[facility] AND [facility]OR[facility] MEDICINE [address] [place], [address] Phone: [phone]   Fax:  [phone]  Physical Therapy Treatment  Patient Details  Name: Amber Ochoa MRN: [id number] Date of Birth: January 31, 1981 Referring Provider: Juanell Fairly MD   Encounter Date: 10/11/2017  PT End of Session - 10/11/17 1824    Visit Number  28    Number of Visits  37    Date for PT Re-Evaluation  10/18/17    Authorization Type  4/4 MEDICAID    PT Start Time  1800    PT Stop Time  1855    PT Time Calculation (min)  55 min    Activity Tolerance  Patient tolerated treatment well    Behavior During Therapy  [facility] for tasks assessed/performed       Past Medical History:  Diagnosis Date  . Asthma   . Blood clotting disorder (HCC)   . Carpal tunnel syndrome of right wrist 08/08/2013  . Chronic asthma, mild persistent, uncomplicated 10/20/2010  . Chronic pelvic pain in female 05/23/2012  . De Quervain's disease (radial styloid tenosynovitis) 05/10/2013  . Diabetes mellitus without complication (HCC)   . Dysarthria 10/10/2014  . Endometriosis   . Ganglion cyst of wrist 04/26/2013  . Headache   . Heart murmur   . 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 Migraine 10/07/2011  . Moderate obstructive sleep apnea 09/23/2011   Overview:  June 15, 2014: SPLIT-NIGHT PSG @ [facility]: overall AHI 26.2/hr. The patient had CPAP applied. The maximal observed pressure was: 11 cm H2O. The recommended pressure is: 11-16 cm H2O.  CPAP set up on 08/28/14 by [facility] [employer]  . PCOS (polycystic ovarian syndrome)   . Polycystic ovarian syndrome 10/20/2010  . Seasonal allergies 11/04/2010  . Tachycardia   . Vitamin D deficiency disease 11/04/2010    Past Surgical History:  Procedure Laterality Date  . ABDOMINAL HYSTERECTOMY    . GALLBLADDER SURGERY    . Tendonitis Right     There were no  vitals filed for this visit.  Subjective Assessment - 10/11/17 1820    Subjective  Pt reports that knee buckled on saturday and caused increased pain. Pt also stated that she has had to be on feet all day for work the past two days and this has significantly irritated knee.     Pertinent History  History of LBP, [place], PCOS, clotting disorder, asthma     Limitations  House hold activities    Diagnostic tests  MRI: Increased swelling over the knee    Patient Stated Goals  Decrease pain, return to workout without modifications    Currently in Pain?  Yes    Pain Score  6     Pain Location  Knee    Pain Orientation  Left    Pain Descriptors / Indicators  Aching    Pain Type  Acute pain    Pain Onset  More than a month ago        TREATMENT Manual Therapy STM to quad tendon and quad muscles x7minto decrease pain and muscle spasms Patella mobilizations with patient in long sitting (superior,inferior,medialandlateral)30s x7  Therapeutic Exercise Single leg squatsto elevated surfacex10  decr verbal cues required forproperform Prone rectus femoris stretch with belt30s x10  Hip abduction on machine 55# x20 B Staggered TRX squats (LLE focus) 10x2   Modalities ( ): with patient  in long sitting Oneice pack placed along the L anterior knee, one large ice pack along lower leg with patient positioned in sitting to decrease pain and spasms within the musculature attaching to the L knee. High volt placed along the superior and inferior of the patella to decrease increased pain and spasms;twolarge pads on the gastrocnemius and hamstringon the L. Patient educated on treatment.  Dry Needling (3 min) (1) .4x 100mm placed along the L quadriceps (vastus lateralis) to decrease pain within the musculature with patient in long sitting. Patient demonstrates slight decrease in pain after performance. Patient educated on risks of therapy and verbally consents to  treatment.    Patient demonstrates decreased pain at the end of the session     PT Education - 10/11/17 1823    Education provided  Yes    Education Details  Pt educated on proper form and technique for therapeutic exercises.    Person(s) Educated  Patient    Methods  Explanation    Comprehension  Verbalized understanding          PT Long Term Goals - 09/07/17 1236      PT LONG TERM GOAL #1   Title  Patient will be independent with HEP to continue benefits of therapy after discharge    Baseline  Heavy cues for Ther ex performance; 06/28/2017: requires cueing for exercise; 07/20/2017: Requires cueing for exercise mod; 08/18/2017: Independent with HEP    Time  6    Period  Weeks    Status  Achieved      PT LONG TERM GOAL #2   Title  Patient will have a worst pain of a 3/10 in the past week to indicate improvement at rest with knee pain.     Baseline  10/10 worst pain; 06/28/2017: 7/10; 07/20/2017: 6/10; 08/18/2017: 5/10; 09/06/2017: 6/10    Time  6    Period  Weeks    Status  On-going      PT LONG TERM GOAL #3   Title  Patient will improve LEFS score to 80/80 to indicate improvement with running without increase in pain.     Baseline  74/80 LEFS; 06/28/2017: 72/80; 07/20/2017: 76/80; 08/18/2017: 77/80; 09/06/2017:48/80    Time  6    Period  Weeks    Status  On-going      PT LONG TERM GOAL #4   Title  Patient will be able to workout at her exercise class without increase in pain with activities.    Baseline  Increase in pain; 06/28/2017: Increase in pain with performance; 08/18/2017: no increase in pain with modifications; 09/06/2017 pt reports having pain during exercise class     Time  6    Period  Weeks    Status  On-going            Plan - 10/11/17 1829    Clinical Impression Statement  Pt presents with improved tolerance to SL squats and staggered TRX squats indicated by her ability to complete increased number of reps without significant increase in pain. Pt presents  with significant tenderness to palpation along quadriceps musculature and muscle spasms along vastus lateralis. This is patients final approved PT session.     Rehab Potential  Fair    Clinical Impairments Affecting Rehab Potential  (+) highly motivation (-) 3 months of unrelenting pain    PT Frequency  2x / week    PT Duration  6 weeks    PT Treatment/Interventions  Electrical Stimulation;Iontophoresis 4mg /ml Dexamethasone;Cryotherapy;Moist  Heat;Ultrasound;Stair training;Gait training;Therapeutic activities;Therapeutic exercise;Balance training;Neuromuscular re-education;Manual techniques;Patient/family education;Passive range of motion;Dry needling    PT Home Exercise Plan  See patient education    Consulted and Agree with Plan of Care  Patient       Patient will benefit from skilled therapeutic intervention in order to improve the following deficits and impairments:  Abnormal gait, Decreased coordination, Decreased endurance, Decreased activity tolerance, Decreased range of motion, Decreased strength, Decreased balance, Difficulty walking, Hypomobility, Increased muscle spasms, Decreased knowledge of precautions  Visit Diagnosis: Acute pain of left knee  Muscle weakness (generalized)     Problem List Patient Active Problem List   Diagnosis Date Noted  . Dysarthria 10/10/2014  . 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 LLQ pain 11/07/2013  . Carpal tunnel syndrome of right wrist 08/08/2013  . De Quervain's disease (radial styloid tenosynovitis) 05/10/2013  . Ganglion cyst of wrist 04/26/2013  . Chronic pelvic pain in female 05/23/2012  . History of sinus tachycardia 10/07/2011  . Migraine 10/07/2011  . Obesity, unspecified 10/07/2011  . Moderate obstructive sleep apnea 09/23/2011  . APL (antiphospholipid syndrome) (HCC) 11/05/2010  . Metabolic syndrome 11/04/2010  . Prediabetes 11/04/2010  . Prehypertension 11/04/2010  . Seasonal allergies 11/04/2010  . Vitamin  D deficiency disease 11/04/2010  . Chronic asthma, mild persistent, uncomplicated 10/20/2010  . Polycystic ovarian syndrome 10/20/2010    Mellody Dance, SPT 10/11/2017, 6:43 PM  [facility] PHYSICAL AND SPORTS MEDICINE [address] [place], [address] Phone: [phone]   Fax:  [phone]  Name: Amber Ochoa MRN: [id number] Date of Birth: December 26, 1980

## 2018-07-22 IMAGING — MR MR KNEE*L* W/O CM
6 series · 40 of 40 positions shown · non-contrast
Comparison: None.

CLINICAL DATA: Left knee pain with swelling and popping since an
injury while working [DATE],[DATE].

EXAM:
MRI OF THE LEFT KNEE WITHOUT CONTRAST
TECHNIQUE: Multiplanar, multisequence MR imaging of the knee was performed. No
intravenous contrast was administered.

[Series 3: PD fat-sat · axial · 3.0mm · 0.33mm/px · z∈[-70,+42]mm · 7 of 35 slices shown (1 of 4)]
[im 1/35]
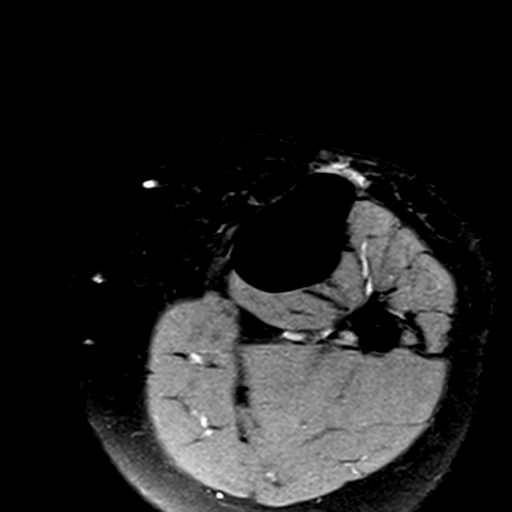
[im 6/35]
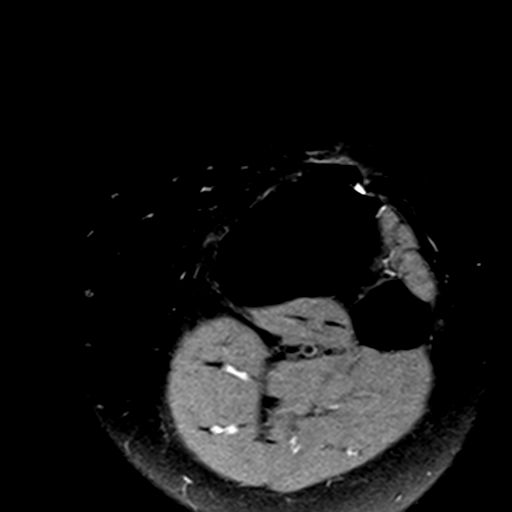
[im 12/35]
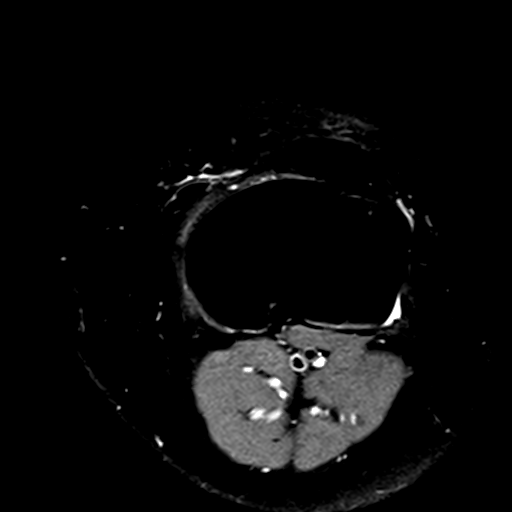
[im 18/35]
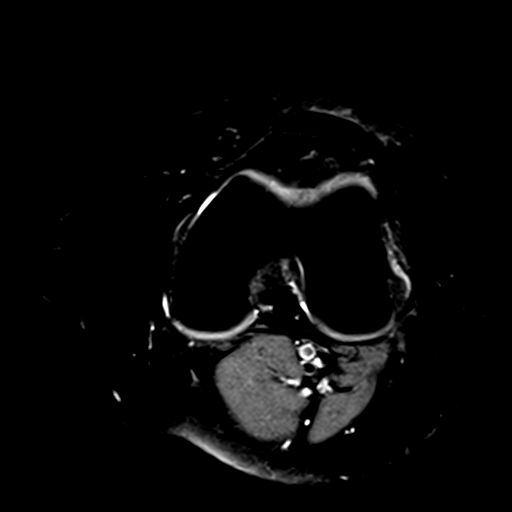
[im 23/35]
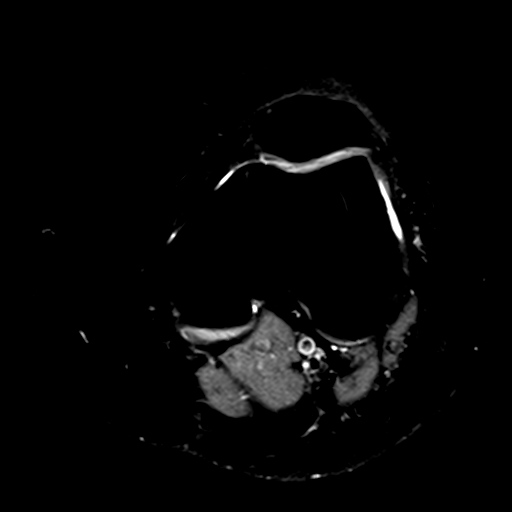
[im 29/35]
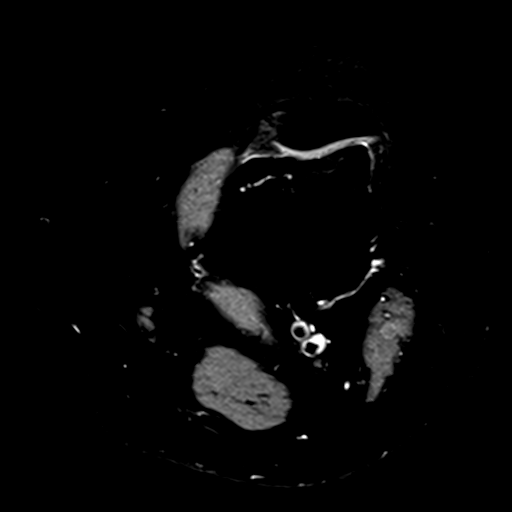
[im 35/35]
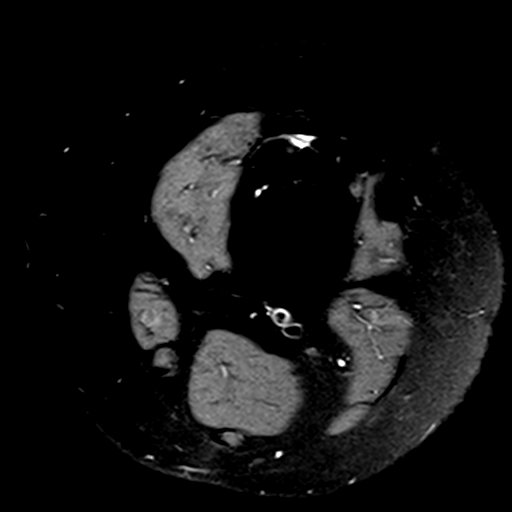

[Series 4: T1 · coronal · 3.0mm · 0.50mm/px · 7 of 31 slices shown]
[im 1/31]
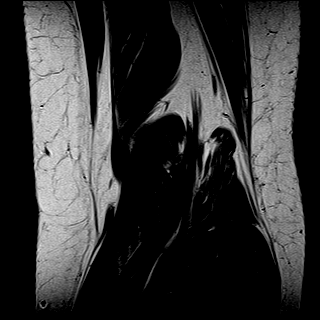
[im 6/31]
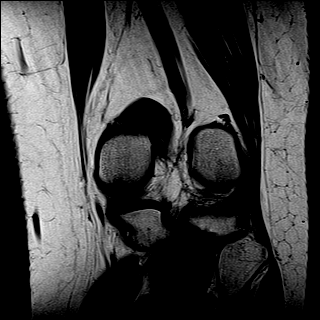
[im 11/31]
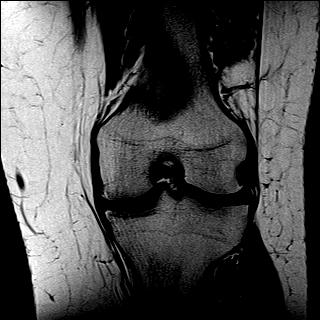
[im 16/31]
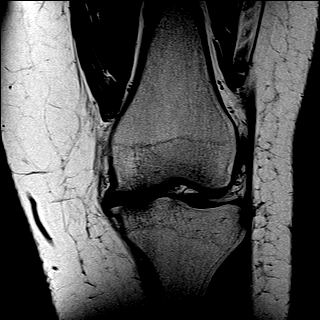
[im 21/31]
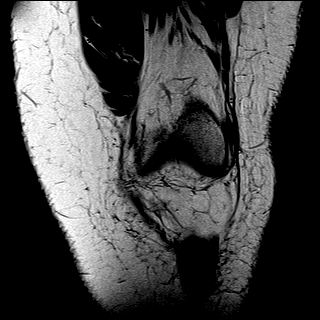
[im 26/31]
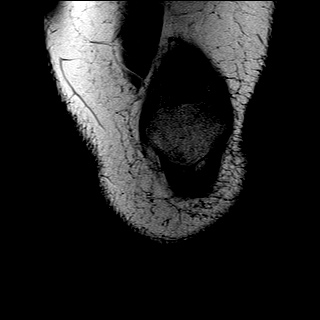
[im 31/31]
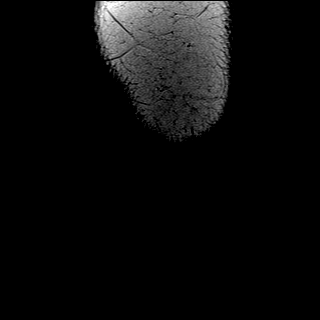

[Series 5: T2 fat-sat · coronal · 3.0mm · 0.50mm/px · 7 of 31 slices shown]
[im 1/31]
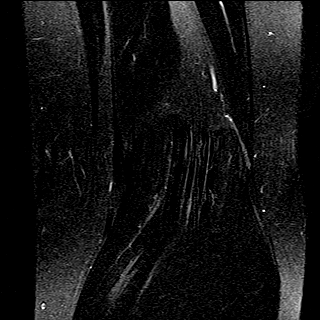
[im 6/31]
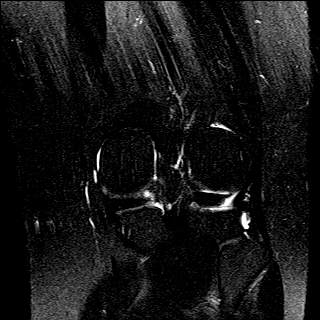
[im 11/31]
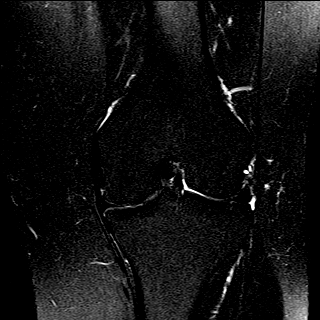
[im 16/31]
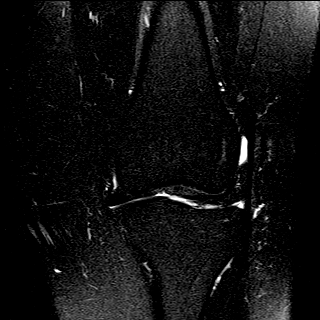
[im 21/31]
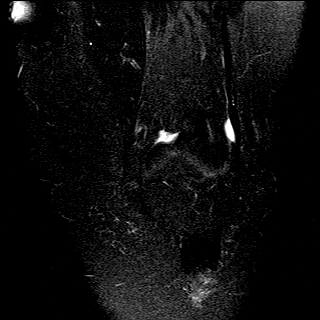
[im 26/31]
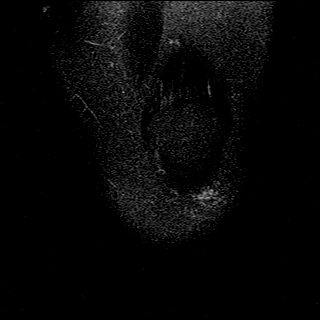
[im 31/31]
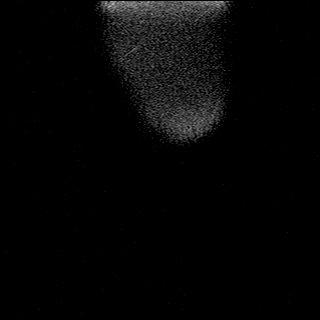

[Series 6: PD fat-sat · coronal · 3.0mm · 0.62mm/px · 7 of 31 slices shown (2 of 4)]
[im 1/31]
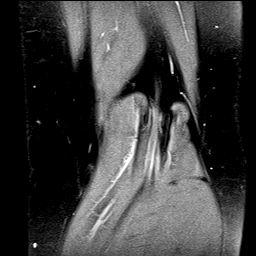
[im 6/31]
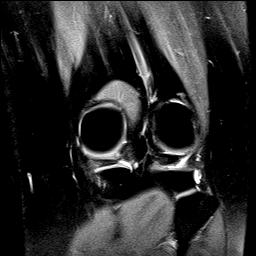
[im 11/31]
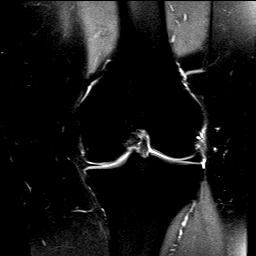
[im 16/31]
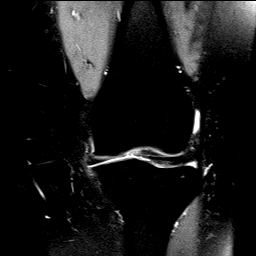
[im 21/31]
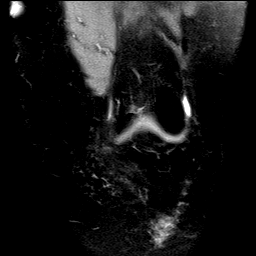
[im 26/31]
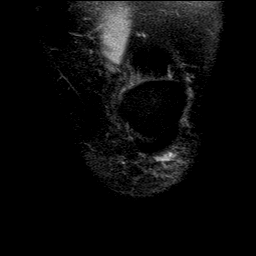
[im 31/31]
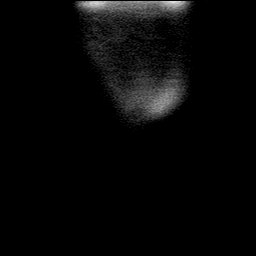

[Series 7: PD fat-sat · sagittal · 3.0mm · 0.62mm/px · 8 of 35 slices shown (3 of 4)]
[im 1/35]
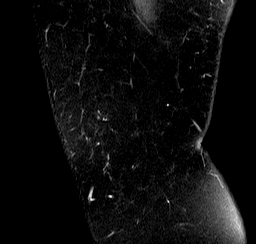
[im 5/35]
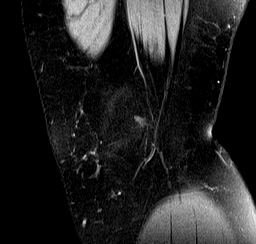
[im 10/35]
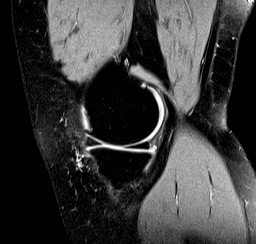
[im 15/35]
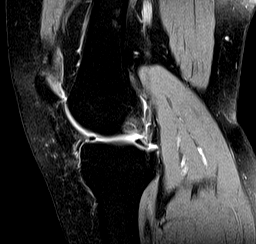
[im 20/35]
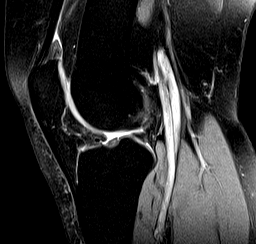
[im 25/35]
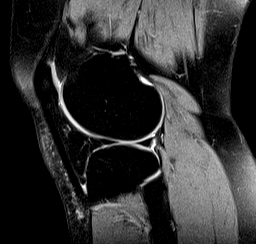
[im 30/35]
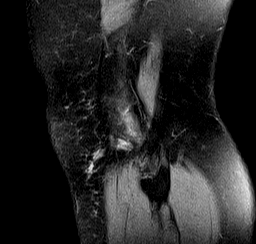
[im 35/35]
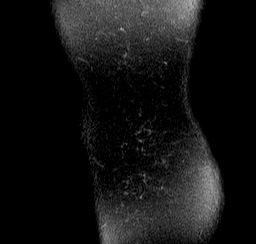

[Series 8: PD fat-sat · coronal · 2.0mm · 0.62mm/px · 4 of 19 slices shown (4 of 4)]
[im 1/19]
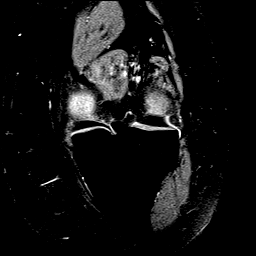
[im 7/19]
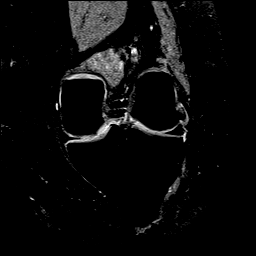
[im 13/19]
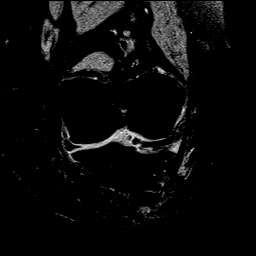
[im 19/19]
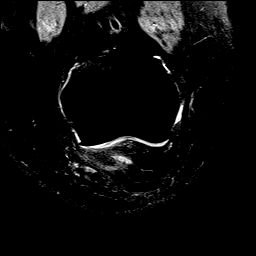

[40 of 40 positions shown; findings below may reference images not displayed]

FINDINGS: MENISCI

Medial meniscus:  Normal.

Lateral meniscus:  Normal.

LIGAMENTS

Cruciates:  Normal.

Collaterals:  Normal.

CARTILAGE

Patellofemoral:  Normal.

Medial:  Normal.

Lateral:  Normal.

Joint: No effusion. Slight edema in the distal quadriceps fat pad
just above the upper pole the patella. This is nonspecific but could
represent the sequela of impingement.

Popliteal Fossa:  No Baker cyst. Intact popliteus tendon.

Extensor Mechanism:  Normal.

Bones:  Normal.

Other: None
IMPRESSION: Slight edema in the distal quadriceps fat pad at the upper pole of
the patella which can be seen with impingement.

Otherwise, normal exam.

## 2020-10-18 ENCOUNTER — Emergency Department
Admission: EM | Admit: 2020-10-18 | Discharge: 2020-10-18 | Disposition: A | Discharge status: Home / Self Care | Payer: Medicaid Other | Attending: Student in an Organized Health Care Education/Training Program | Admitting: Student in an Organized Health Care Education/Training Program

## 2020-10-18 ENCOUNTER — Other Ambulatory Visit: Payer: Self-pay

## 2020-10-18 ENCOUNTER — Encounter: Payer: Self-pay | Admitting: Emergency Medicine

## 2020-10-18 DIAGNOSIS — Z23 Encounter for immunization: Secondary | ICD-10-CM | POA: Insufficient documentation

## 2020-10-18 DIAGNOSIS — J4530 Mild persistent asthma, uncomplicated: Secondary | ICD-10-CM | POA: Diagnosis not present

## 2020-10-18 DIAGNOSIS — S81812A Laceration without foreign body, left lower leg, initial encounter: Secondary | ICD-10-CM | POA: Insufficient documentation

## 2020-10-18 DIAGNOSIS — Z7982 Long term (current) use of aspirin: Secondary | ICD-10-CM | POA: Diagnosis not present

## 2020-10-18 DIAGNOSIS — W260XXA Contact with knife, initial encounter: Secondary | ICD-10-CM | POA: Insufficient documentation

## 2020-10-18 DIAGNOSIS — Y92000 Kitchen of unspecified non-institutional (private) residence as  the place of occurrence of the external cause: Secondary | ICD-10-CM | POA: Diagnosis not present

## 2020-10-18 DIAGNOSIS — S8992XA Unspecified injury of left lower leg, initial encounter: Secondary | ICD-10-CM | POA: Diagnosis present

## 2020-10-18 DIAGNOSIS — E119 Type 2 diabetes mellitus without complications: Secondary | ICD-10-CM | POA: Diagnosis not present

## 2020-10-18 MED ORDER — BACITRACIN-NEOMYCIN-POLYMYXIN 400-5-5000 EX OINT
TOPICAL_OINTMENT | Freq: Once | CUTANEOUS | Status: AC
  Administered 2020-10-18: 1 via TOPICAL
  Filled 2020-10-18: qty 1

## 2020-10-18 MED ORDER — TETANUS-DIPHTH-ACELL PERTUSSIS 5-2.5-18.5 LF-MCG/0.5 IM SUSY: 0.5000 mL | PREFILLED_SYRINGE | Freq: Once | INTRAMUSCULAR | Status: AC

## 2020-10-18 MED ORDER — LIDOCAINE HCL (PF) 1 % IJ SOLN
5.0000 mL | Freq: Once | INTRAMUSCULAR | Status: AC
  Administered 2020-10-18: 5 mL via INTRADERMAL
  Filled 2020-10-18: qty 5

## 2020-10-18 MED ORDER — TETANUS-DIPHTH-ACELL PERTUSSIS 5-2.5-18.5 LF-MCG/0.5 IM SUSY
PREFILLED_SYRINGE | INTRAMUSCULAR | Status: AC
  Administered 2020-10-18: 0.5 mL via INTRAMUSCULAR
  Filled 2020-10-18: qty 0.5
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
## 2020-10-18 NOTE — Discharge Instructions (Signed)
Do not get the sutured area wet for 24 hours. After 24 hours, shower/bathe as usual and pat the area dry. °Change the bandage 2 times per day and apply antibiotic ointment. °Leave open to air when at no risk of getting the area dirty, but cover at night before bed. °See your PCP or go to Urgent Care in 10 days for suture removal or sooner for signs or concern of infection. ° °

## 2020-10-18 NOTE — ED Notes (Signed)
Ointment and Band-Aid placed on laceration

## 2020-10-30 ENCOUNTER — Emergency Department: Payer: Medicaid Other

## 2020-10-30 ENCOUNTER — Encounter: Payer: Self-pay | Admitting: Radiology

## 2020-10-30 ENCOUNTER — Emergency Department
Admission: EM | Admit: 2020-10-30 | Discharge: 2020-10-30 | Disposition: A | Discharge status: Home / Self Care | Payer: Medicaid Other | Attending: Emergency Medicine | Admitting: Emergency Medicine

## 2020-10-30 ENCOUNTER — Other Ambulatory Visit: Payer: Self-pay

## 2020-10-30 DIAGNOSIS — R110 Nausea: Secondary | ICD-10-CM | POA: Diagnosis not present

## 2020-10-30 DIAGNOSIS — J4530 Mild persistent asthma, uncomplicated: Secondary | ICD-10-CM | POA: Insufficient documentation

## 2020-10-30 DIAGNOSIS — Z7982 Long term (current) use of aspirin: Secondary | ICD-10-CM | POA: Diagnosis not present

## 2020-10-30 DIAGNOSIS — R1031 Right lower quadrant pain: Secondary | ICD-10-CM | POA: Diagnosis not present

## 2020-10-30 LAB — CBC
HCT: 36.1 % (ref 36.0–46.0)
Hemoglobin: 12.4 g/dL (ref 12.0–15.0)
MCH: 30 pg (ref 26.0–34.0)
MCHC: 34.3 g/dL (ref 30.0–36.0)
MCV: 87.2 fL (ref 80.0–100.0)
Platelets: 285 10*3/uL (ref 150–400)
RBC: 4.14 MIL/uL (ref 3.87–5.11)
RDW: 12.9 % (ref 11.5–15.5)
WBC: 7.5 10*3/uL (ref 4.0–10.5)
nRBC: 0 % (ref 0.0–0.2)

## 2020-10-30 LAB — URINALYSIS, COMPLETE (UACMP) WITH MICROSCOPIC
Bilirubin Urine: NEGATIVE
Glucose, UA: NEGATIVE mg/dL
Ketones, ur: NEGATIVE mg/dL
Leukocytes,Ua: NEGATIVE
Nitrite: NEGATIVE
Protein, ur: NEGATIVE mg/dL
Specific Gravity, Urine: 1.023 (ref 1.005–1.030)
pH: 5 (ref 5.0–8.0)

## 2020-10-30 LAB — COMPREHENSIVE METABOLIC PANEL
ALT: 16 U/L (ref 0–44)
AST: 17 U/L (ref 15–41)
Albumin: 4.2 g/dL (ref 3.5–5.0)
Alkaline Phosphatase: 92 U/L (ref 38–126)
Anion gap: 9 (ref 5–15)
BUN: 12 mg/dL (ref 6–20)
CO2: 24 mmol/L (ref 22–32)
Calcium: 9.2 mg/dL (ref 8.9–10.3)
Chloride: 106 mmol/L (ref 98–111)
Creatinine, Ser: 0.75 mg/dL (ref 0.44–1.00)
GFR, Estimated: >60 mL/min (ref >60)
Glucose, Bld: 101 mg/dL — ABNORMAL HIGH (ref 70–99)
Potassium: 3.9 mmol/L (ref 3.5–5.1)
Sodium: 139 mmol/L (ref 135–145)
Total Bilirubin: 0.6 mg/dL (ref 0.3–1.2)
Total Protein: 7.7 g/dL (ref 6.5–8.1)

## 2020-10-30 LAB — PREGNANCY, URINE: Preg Test, Ur: NEGATIVE

## 2020-10-30 LAB — LIPASE, BLOOD: Lipase: 29 U/L (ref 11–51)

## 2020-10-30 MED ORDER — IOHEXOL 350 MG/ML SOLN
100.0000 mL | Freq: Once | INTRAVENOUS | Status: AC | PRN
  Administered 2020-10-30: 100 mL via INTRAVENOUS

## 2020-10-30 MED ORDER — KETOROLAC TROMETHAMINE 30 MG/ML IJ SOLN
15.0000 mg | Freq: Once | INTRAMUSCULAR | Status: AC
  Administered 2020-10-30: 15 mg via INTRAVENOUS
  Filled 2020-10-30: qty 1

## 2020-10-30 MED ORDER — ONDANSETRON 4 MG PO TBDP: 4.0000 mg | ORAL_TABLET | Freq: Three times a day (TID) | ORAL | 0 refills | Status: DC | PRN

## 2020-10-30 MED ORDER — ONDANSETRON HCL 4 MG/2ML IJ SOLN
4.0000 mg | Freq: Once | INTRAMUSCULAR | Status: AC
  Administered 2020-10-30: 4 mg via INTRAVENOUS
  Filled 2020-10-30: qty 2

## 2020-10-30 MED ORDER — FENTANYL CITRATE (PF) 100 MCG/2ML IJ SOLN
50.0000 ug | Freq: Once | INTRAMUSCULAR | Status: AC
  Administered 2020-10-30: 50 ug via INTRAVENOUS
  Filled 2020-10-30: qty 2

## 2020-10-30 MED ORDER — LACTATED RINGERS IV BOLUS
1000.0000 mL | Freq: Once | INTRAVENOUS | Status: AC
  Administered 2020-10-30: 1000 mL via INTRAVENOUS

## 2020-10-30 NOTE — Discharge Instructions (Addendum)
Use Tylenol for pain and fevers.  Up to 1000 mg per dose, up to 4 times per day.  Do not take more than 4000 mg of Tylenol/acetaminophen within 24 hours..  Use naproxen/Aleve for anti-inflammatory pain relief. Use up to 500mg every 12 hours. Do not take more frequently than this. Do not use other NSAIDs (ibuprofen, Advil) while taking this medication. It is safe to take Tylenol with this.   

## 2020-10-30 NOTE — ED Triage Notes (Signed)
Pt here with right lower abd pain for 2 days. Pt reports nausea but denies V/D. Pt denies fever. Normal BM. Pt still has appendix.

## 2020-10-30 NOTE — ED Provider Notes (Signed)
[facility] Emergency Department Provider Note ____________________________________________   Event Date/Time   First MD Initiated Contact with Patient 10/30/20 1818     (approximate)  I have reviewed the triage vital signs and the nursing notes.  HISTORY  Chief Complaint Abdominal Pain   HPI Amber Ochoa is a 40 y.o. femalewho presents to the ED for evaluation of abdominal pain and nausea.  Chart review indicates history of obesity and hysterectomy.   Patient presents to the ED for evaluation of 2 days of increasing lower abdominal pain with associated nausea.  She reports lower abdominal pain, primarily on the right side, but has been increasingly worsening over the past 2 days.  She reports associated nausea without vomiting.  She reports worsening pain while driving her car here today while going over bumps.  Denies dysuria, diarrhea, and reports a "normal" bowel movement earlier this morning.   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 The patient had CPAP applied. The maximal observed pressure was: 11 cm H2O. The recommended pressure is: 11-16 cm H2O.  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   LLQ pain 11/07/2013   Carpal tunnel syndrome of right wrist 08/08/2013   Tommi Rumps Quervain's disease (radial styloid tenosynovitis) 05/10/2013   Ganglion cyst of wrist 04/26/2013   Chronic pelvic pain in female 05/23/2012   History of sinus tachycardia 10/07/2011   Migraine 10/07/2011   Obesity, unspecified 10/07/2011   Moderate obstructive sleep apnea 09/23/2011   APL (antiphospholipid syndrome) (HCC) 11/05/2010   Metabolic syndrome 11/04/2010   Prediabetes 11/04/2010   Prehypertension 11/04/2010   Seasonal allergies 11/04/2010   Vitamin D deficiency disease 11/04/2010   Chronic asthma, mild persistent, uncomplicated 10/20/2010   Polycystic ovarian syndrome 10/20/2010    Past Surgical History:  Procedure Laterality Date   ABDOMINAL HYSTERECTOMY     GALLBLADDER SURGERY     Tendonitis Right     Prior to Admission medications   Medication Sig Start Date End Date Taking? Authorizing Provider  ondansetron (ZOFRAN ODT) 4 MG disintegrating tablet Take 1 tablet (4 mg total) by mouth every 8 (eight) hours as needed for nausea or vomiting. 10/30/20  Yes Delton Prairie, MD  albuterol (PROVENTIL HFA;VENTOLIN HFA) 108 (90 BASE) MCG/ACT inhaler Inhale into the lungs every 6 (six) hours as needed for wheezing or shortness of breath.    [provider]  albuterol (PROVENTIL HFA;VENTOLIN HFA) 108 (90 Base) MCG/ACT inhaler Inhale 2 puffs  into the lungs every 6 (six) hours as needed for wheezing or shortness of breath. 09/14/17   Willy Eddy, MD  aspirin EC 81 MG tablet Take 81 mg by mouth daily.    [provider]  gabapentin (NEURONTIN) 400 MG capsule Take 400 mg by mouth at bedtime.    [provider]  omeprazole (PRILOSEC) 20 MG capsule Take by mouth. 12/09/15 12/08/16   [provider]  ondansetron (ZOFRAN ODT) 4 MG disintegrating tablet Take 1 tablet (4 mg total) by mouth every 8 (eight) hours as needed for nausea or vomiting. Patient not taking: Reported on 05/12/2017 11/16/16   Rebecka Apley, MD  valACYclovir (VALTREX) 1000 MG tablet Take by mouth. 10/18/14   [provider]    Allergies Morphine, Morphine and related, Sulfa antibiotics, Other, Sudafed [pseudoephedrine hcl], and Sulfamethoxazole-trimethoprim  No family history on file.  Social History Social History   Tobacco Use   Smoking status: Never   Smokeless tobacco: Never  Substance Use Topics   Alcohol use: No   Drug use: No    Review of Systems  Constitutional: No fever/chills Eyes: No visual changes. ENT: No sore throat. Cardiovascular: Denies chest pain. Respiratory: Denies shortness of breath. Gastrointestinal: Positive for abdominal pain and nausea no vomiting.  No diarrhea.  No constipation. Genitourinary: Negative for dysuria. Musculoskeletal: Negative for back pain. Skin: Negative for rash. Neurological: Negative for headaches, focal weakness or numbness.  ____________________________________________   PHYSICAL EXAM:  VITAL SIGNS: Vitals:   10/30/20 1945 10/30/20 2043  BP: 124/81 115/76  Pulse: 85 77  Resp: 18 18  Temp:    SpO2: 98% 100%     Constitutional: Alert and oriented. Well appearing and in no acute distress. Eyes: Conjunctivae are normal. PERRL. EOMI. Head: Atraumatic. Nose: No congestion/rhinnorhea. Mouth/Throat: Mucous membranes are moist.  Oropharynx non-erythematous. Neck: No stridor. No cervical spine tenderness to palpation. Cardiovascular: Normal rate, regular rhythm. Grossly normal heart sounds.  Good peripheral circulation. Respiratory: Normal respiratory effort.  No retractions. Lungs CTAB. Gastrointestinal: Soft , nondistended.  RLQ tenderness right Burney's point is present with associated voluntary guarding.   Rovsing and psoas signs are positive. Musculoskeletal: No lower extremity tenderness nor edema.  No joint effusions. No signs of acute trauma. Neurologic:  Normal speech and language. No gross focal neurologic deficits are appreciated. No gait instability noted. Skin:  Skin is warm, dry and intact. No rash noted. Psychiatric: Mood and affect are normal. Speech and behavior are normal.  ____________________________________________   LABS (all labs ordered are listed, but only abnormal results are displayed)  Labs Reviewed  COMPREHENSIVE METABOLIC PANEL - Abnormal; Notable for the following components:      Result Value   Glucose, Bld 101 (*)    All other components within normal limits  URINALYSIS, COMPLETE (UACMP) WITH MICROSCOPIC - Abnormal; Notable for the following components:   Color, Urine YELLOW (*)    APPearance HAZY (*)    Hgb urine dipstick SMALL (*)    Bacteria, UA MANY (*)    All other components within normal limits  LIPASE, BLOOD  CBC  PREGNANCY, URINE  POC URINE PREG, ED   ____________________________________________  12 Lead EKG   ____________________________________________  RADIOLOGY  ED MD interpretation: CT abdomen/pelvis reviewed by me without evidence of acute intra-abdominal pathology  Official radiology report(s): CT ABDOMEN PELVIS W CONTRAST  Result Date: 10/30/2020 CLINICAL DATA:  Right lower quadrant pain EXAM: CT ABDOMEN AND PELVIS WITH CONTRAST TECHNIQUE: Multidetector CT imaging of the  abdomen and pelvis was performed using the standard protocol following bolus administration of intravenous contrast. CONTRAST:  OMNIPAQUE IOHEXOL 350 MG/ML SOLN COMPARISON:  None. FINDINGS: Lower chest: No acute abnormality. Hepatobiliary: No focal liver abnormality is seen. Status post cholecystectomy. No biliary dilatation. Pancreas: Unremarkable. No pancreatic ductal dilatation or surrounding inflammatory changes. Spleen: Unremarkable. Adrenals/Urinary  Tract: Adrenals are unremarkable. Kidneys and partially distended bladder are unremarkable. Stomach/Bowel: Stomach is within normal limits. Bowel is normal in caliber. Normal appendix. Vascular/Lymphatic: No significant vascular abnormality. No enlarged lymph nodes. Reproductive: Status post hysterectomy. No adnexal masses. Other: No free fluid.  Abdominal wall is unremarkable. Musculoskeletal: Lower lumbar degenerative changes. No acute osseous abnormality. IMPRESSION: No acute abnormality.  Normal appendix. Electronically Signed   By: Guadlupe Spanish M.D.   On: 10/30/2020 20:20    ____________________________________________   PROCEDURES and INTERVENTIONS  Procedure(s) performed (including Critical Care):  .1-3 Lead EKG Interpretation  Date/Time: 10/30/2020 7:44 PM Performed by: Delton Prairie, MD Authorized by: Delton Prairie, MD     Interpretation: normal     ECG rate:  90   ECG rate assessment: normal     Rhythm: sinus rhythm     Ectopy: none     Conduction: normal    Medications  fentaNYL (SUBLIMAZE) injection 50 mcg (50 mcg Intravenous Given 10/30/20 1942)  ondansetron (ZOFRAN) injection 4 mg (4 mg Intravenous Given 10/30/20 1938)  lactated ringers bolus 1,000 mL (1,000 mLs Intravenous New Bag/Given 10/30/20 1943)  iohexol (OMNIPAQUE) 350 MG/ML injection 100 mL (100 mLs Intravenous Contrast Given 10/30/20 2002)  ketorolac (TORADOL) 30 MG/ML injection 15 mg (15 mg Intravenous Given 10/30/20 2043)    ____________________________________________   MDM / ED COURSE   40 year old woman s/p hysterectomy presents to the ED with couple days of lower abdominal pain without evidence of acute pathology and amenable to outpatient management.  Exam initially with stigmata concerning for acute appendicitis and CT imaging obtained and effectively rules this out.  No evidence of ovarian cysts on her CT and her history is very inconsistent with ovarian torsion considering the gradual onset of her symptoms.   Blood work is quite reassuring without evidence of acute derangements, no leukocytosis or evidence of pancreatitis.  Urine without infectious features.  Improving symptoms with anti-inflammatories.  We will discharge with return precautions and symptomatic measures.   Clinical Course as of 10/30/20 2121  Thu Oct 30, 2020  2045 Reassessed the patient and discussed benign work-up and plan of care. [DS]  2120 Reassessed.  Patient reports feeling little bit better after the Toradol.  We again discussed her work-up without acute derangements.  Reexamined her abdomen with some improvement to her tenderness and no longer guarding.  We discussed return precautions. [DS]    Clinical Course User Index [DS] Delton Prairie, MD    ____________________________________________   FINAL CLINICAL IMPRESSION(S) / ED DIAGNOSES  Final diagnoses:  Right lower quadrant abdominal pain  Nausea     ED Discharge Orders          Ordered    ondansetron (ZOFRAN ODT) 4 MG disintegrating tablet  Every 8 hours PRN        10/30/20 2120             Antoine Vandermeulen   Note:  This document was prepared using Dragon voice recognition software and may include unintentional dictation errors.    Delton Prairie, MD 10/30/20 2122

## 2020-11-06 NOTE — ED Provider Notes (Signed)
[facility] Emergency Department Provider Note  ____________________________________________  Time seen: Approximately 5:08 PM  I have reviewed the triage vital signs and the nursing notes.   HISTORY  Chief Complaint Laceration   HPI Amber Ochoa is a 40 y.o. female who presents to the emergency department for laceration repair.  See MSE note.   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 The patient had CPAP applied. The maximal observed pressure was: 11 cm H2O. The recommended pressure is: 11-16 cm H2O.  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   LLQ pain 11/07/2013   Carpal tunnel syndrome of right wrist 08/08/2013   Tommi Rumps Quervain's disease (radial styloid tenosynovitis) 05/10/2013   Ganglion cyst of wrist 04/26/2013   Chronic pelvic pain in female 05/23/2012   History of sinus tachycardia 10/07/2011   Migraine 10/07/2011   Obesity, unspecified 10/07/2011   Moderate obstructive sleep apnea 09/23/2011    APL (antiphospholipid syndrome) (HCC) 11/05/2010   Metabolic syndrome 11/04/2010   Prediabetes 11/04/2010   Prehypertension 11/04/2010   Seasonal allergies 11/04/2010   Vitamin D deficiency disease 11/04/2010   Chronic asthma, mild persistent, uncomplicated 10/20/2010   Polycystic ovarian syndrome 10/20/2010    Past Surgical History:  Procedure Laterality Date   ABDOMINAL HYSTERECTOMY     GALLBLADDER SURGERY     Tendonitis Right     Prior to Admission medications   Medication Sig Start Date End Date Taking? Authorizing Provider  albuterol (PROVENTIL HFA;VENTOLIN HFA) 108 (90 BASE) MCG/ACT inhaler Inhale into the lungs every 6 (six) hours as needed for wheezing or shortness of breath.    [provider]  albuterol (PROVENTIL HFA;VENTOLIN HFA) 108 (90 Base) MCG/ACT inhaler Inhale 2 puffs into the lungs every 6 (six) hours as needed for wheezing or shortness of breath. 09/14/17   Willy Eddy, MD  aspirin EC 81 MG tablet Take 81 mg by mouth daily.    [provider]  gabapentin (NEURONTIN) 400 MG capsule Take 400 mg by mouth at bedtime.    [provider]  omeprazole (PRILOSEC) 20 MG capsule Take by mouth. 12/09/15 12/08/16  [provider]  ondansetron (ZOFRAN ODT) 4 MG disintegrating tablet Take 1 tablet (4 mg total) by mouth every 8 (eight) hours as needed for nausea or vomiting. Patient not taking: Reported on 05/12/2017 11/16/16   Rebecka Apley, MD  ondansetron (ZOFRAN ODT) 4 MG disintegrating tablet Take 1 tablet (4 mg total) by mouth  every 8 (eight) hours as needed for nausea or vomiting. 10/30/20   Delton Prairie, MD  valACYclovir (VALTREX) 1000 MG tablet Take by mouth. 10/18/14   [provider]    Allergies Morphine, Morphine and related, Sulfa antibiotics, Other, Sudafed [pseudoephedrine hcl], and Sulfamethoxazole-trimethoprim  No family history on file.  Social History Social History   Tobacco Use   Smoking status: Never    Smokeless tobacco: Never  Substance Use Topics   Alcohol use: No   Drug use: No    Review of Systems  Constitutional: Negative for fever. Respiratory: Negative for cough or shortness of breath.  Musculoskeletal: Negative for myalgias Skin: Positive for laceration Neurological: Negative for numbness or paresthesias. ____________________________________________   PHYSICAL EXAM:  VITAL SIGNS: ED Triage Vitals  Enc Vitals Group     BP 10/18/20 1743 (!) 156/90     Pulse Rate 10/18/20 1743 (!) 102     Resp 10/18/20 1743 18     Temp 10/18/20 1743 98.3 F (36.8 C)     Temp Source 10/18/20 1743 Oral     SpO2 10/18/20 1743 98 %     Weight 10/18/20 1738 231 lb (104.8 kg)     Height 10/18/20 1738 5\' 3"  (1.6 m)     Head Circumference --      Peak Flow --      Pain Score 10/18/20 1737 10     Pain Loc --      Pain Edu? --      Excl. in GC? --      Constitutional: Well appearing. Eyes: Conjunctivae are clear without discharge or drainage. Nose: No rhinorrhea noted. Mouth/Throat: Airway is patent.  Neck: No stridor. Unrestricted range of motion observed. Cardiovascular: Capillary refill is <3 seconds.  Respiratory: Respirations are even and unlabored.. Musculoskeletal: Unrestricted range of motion observed. Neurologic: Awake, alert, and oriented x 4.  Skin:  2cm laceration to the left calf  ____________________________________________   LABS (all labs ordered are listed, but only abnormal results are displayed)  Labs Reviewed - No data to display ____________________________________________  EKG  Not indicated. ____________________________________________  RADIOLOGY  Not indicated. ____________________________________________   PROCEDURES  .07/25/22Laceration Repair  Date/Time: 11/06/2020 5:14 PM Performed by: 01/06/2021, FNP Authorized by: Chinita Pester, FNP   Consent:    Consent obtained:  Verbal   Consent given by:  Patient   Risks discussed:   Infection and poor cosmetic result Universal protocol:    Patient identity confirmed:  Verbally with patient Anesthesia:    Anesthesia method:  Local infiltration   Local anesthetic:  Lidocaine 1% w/o epi Laceration details:    Location:  Leg   Leg location:  L lower leg Pre-procedure details:    Preparation:  Patient was prepped and draped in usual sterile fashion Treatment:    Area cleansed with:  Povidone-iodine and saline   Amount of cleaning:  Standard   Irrigation method:  Syringe Skin repair:    Repair method:  Sutures   Suture size:  4-0   Suture material:  Nylon   Suture technique:  Simple interrupted   Number of sutures:  4 Approximation:    Approximation:  Close Repair type:    Repair type:  Simple Post-procedure details:    Dressing:  Antibiotic ointment   Procedure completion:  Tolerated well, no immediate complications ____________________________________________   INITIAL IMPRESSION / ASSESSMENT AND PLAN / ED COURSE  Amber Ochoa is a 40 y.o. female presents to the ER  for laceration repair.  Procedure performed as above.  Standard wound care instructions provided.  She will be encouraged to have the sutures removed in approximately 10 days by primary care or urgent care.  Medications  lidocaine (PF) (XYLOCAINE) 1 % injection 5 mL (5 mLs Intradermal Given by Other 10/18/20 2040)  neomycin-bacitracin-polymyxin (NEOSPORIN) ointment packet (1 application Topical Given 10/18/20 2041)  Tdap (BOOSTRIX) injection 0.5 mL (0.5 mLs Intramuscular Given 10/18/20 2047)     Pertinent labs & imaging results that were available during my care of the patient were reviewed by me and considered in my medical decision making (see chart for details).  ____________________________________________   FINAL CLINICAL IMPRESSION(S) / ED DIAGNOSES  Final diagnoses:  Laceration of left lower leg, initial encounter    ED Discharge Orders     None        Note:  This  document was prepared using Dragon voice recognition software and may include unintentional dictation errors.   Chinita Pester, FNP 11/06/20 1717    Willy Eddy, MD 11/17/20 0700

## 2021-01-12 ENCOUNTER — Other Ambulatory Visit: Payer: Self-pay

## 2021-01-12 ENCOUNTER — Ambulatory Visit
Admission: EM | Admit: 2021-01-12 | Discharge: 2021-01-12 | Disposition: A | Discharge status: Home / Self Care | Payer: Medicaid Other

## 2021-01-12 DIAGNOSIS — J069 Acute upper respiratory infection, unspecified: Secondary | ICD-10-CM | POA: Diagnosis not present

## 2021-01-12 DIAGNOSIS — H66003 Acute suppurative otitis media without spontaneous rupture of ear drum, bilateral: Secondary | ICD-10-CM

## 2021-01-12 HISTORY — DX: Antiphospholipid syndrome: D68.61

## 2021-01-12 MED ORDER — PROMETHAZINE-DM 6.25-15 MG/5ML PO SYRP: 5.0000 mL | ORAL_SOLUTION | Freq: Four times a day (QID) | ORAL | 0 refills | Status: DC | PRN

## 2021-01-12 MED ORDER — BENZONATATE 100 MG PO CAPS: 200.0000 mg | ORAL_CAPSULE | Freq: Three times a day (TID) | ORAL | 0 refills | Status: DC

## 2021-01-12 MED ORDER — IPRATROPIUM BROMIDE 0.06 % NA SOLN: 2.0000 | Freq: Four times a day (QID) | NASAL | 12 refills | Status: AC

## 2021-01-12 MED ORDER — AMOXICILLIN-POT CLAVULANATE 875-125 MG PO TABS: 1.0000 | ORAL_TABLET | Freq: Two times a day (BID) | ORAL | 0 refills | Status: AC

## 2021-01-12 NOTE — Discharge Instructions (Signed)
Take the Augmentin twice daily for 10 days with food for treatment of your ear infection.  Take an over-the-counter probiotic 1 hour after each dose of antibiotic to prevent diarrhea.  Use over-the-counter Tylenol and ibuprofen as needed for pain or fever.  Place a hot water bottle, or heating pad, underneath your pillowcase at night to help dilate up your ear and aid in pain relief as well as resolution of the infection.  Use the Atrovent nasal spray, 2 squirts in each nostril every 6 hours, as needed for runny nose and postnasal drip.  Use the Tessalon Perles every 8 hours during the day.  Take them with a small sip of water.  They may give you some numbness to the base of your tongue or a metallic taste in your mouth, this is normal.  Use the Promethazine DM cough syrup at bedtime for cough and congestion.  It will make you drowsy so do not take it during the day.  Return for reevaluation or see your primary care provider for any new or worsening symptoms.   

## 2021-01-12 NOTE — ED Provider Notes (Signed)
[facility]    CSN: [id number] Arrival date & time: 01/12/21  1316      History   Chief Complaint Chief Complaint  Patient presents with   Nasal Congestion   Otalgia    HPI Kortlynn Poust Helwig is a 40 y.o. female.   HPI  39 year old female here for evaluation of respiratory complaints.  Patient reports that she has been experiencing nasal congestion with a yellow-green nasal discharge and similar postnasal drip, right ear pain with drainage and left ear fullness, and a nonproductive cough for the past 5 days.  The first day of symptoms she did have a elevated temp of 99 but has not had one since.  She denies shortness with wheezing or GI complaints.  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 The patient had CPAP applied. The maximal observed pressure was: 11 cm H2O. The recommended pressure is: 11-16 cm H2O.  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   LLQ  pain 11/07/2013   Carpal tunnel syndrome of right wrist 08/08/2013   Tommi Rumps Quervain's disease (radial styloid tenosynovitis) 05/10/2013   Ganglion cyst of wrist 04/26/2013   Chronic pelvic pain in female 05/23/2012   History of sinus tachycardia 10/07/2011   Migraine 10/07/2011   Obesity, unspecified 10/07/2011   Moderate obstructive sleep apnea 09/23/2011   APL (antiphospholipid syndrome) (HCC) 11/05/2010   Metabolic syndrome 11/04/2010   Prediabetes 11/04/2010   Prehypertension 11/04/2010   Seasonal allergies 11/04/2010   Vitamin D deficiency disease 11/04/2010   Chronic asthma, mild persistent, uncomplicated 10/20/2010   Polycystic ovarian syndrome 10/20/2010    Past Surgical History:  Procedure Laterality Date   ABDOMINAL HYSTERECTOMY     GALLBLADDER SURGERY     Tendonitis Right     OB History   No obstetric history on file.      Home Medications    Prior to Admission medications   Medication Sig Start Date End Date Taking? Authorizing Provider  albuterol (PROVENTIL HFA;VENTOLIN HFA) 108 (90 BASE) MCG/ACT inhaler Inhale into the lungs every 6 (six) hours as needed for wheezing or shortness of breath.   Yes [provider]  albuterol (PROVENTIL HFA;VENTOLIN HFA) 108 (90 Base) MCG/ACT inhaler Inhale 2 puffs into the lungs every 6 (six) hours as needed for wheezing or shortness of breath. 09/14/17  Yes Willy Eddy, MD  amitriptyline (ELAVIL) 10 MG tablet Take 10 mg by mouth at bedtime.  2 at night   Yes [provider]  amoxicillin-clavulanate (AUGMENTIN) 875-125 MG tablet Take 1 tablet by mouth every 12 (twelve) hours for 10 days. 01/12/21 01/22/21 Yes Becky Augusta, NP  aspirin EC 81 MG tablet Take 81 mg by mouth daily.   Yes [provider]  benzonatate (TESSALON) 100 MG capsule Take 2 capsules (200 mg total) by mouth every 8 (eight) hours. 01/12/21  Yes Becky Augusta, NP  Fluticasone-Umeclidin-Vilant (TRELEGY ELLIPTA) 200-62.5-25 MCG/INH AEPB  Inhale into the lungs.   Yes [provider]  ipratropium (ATROVENT) 0.06 % nasal spray Place 2 sprays into both nostrils 4 (four) times daily. 01/12/21  Yes Becky Augusta, NP  metFORMIN (GLUMETZA) 500 MG (MOD) 24 hr tablet Take 500 mg by mouth daily with breakfast. 4 tablets at night   Yes [provider]  promethazine-dextromethorphan (PROMETHAZINE-DM) 6.25-15 MG/5ML syrup Take 5 mLs by mouth 4 (four) times daily as needed. 01/12/21  Yes Becky Augusta, NP  rOPINIRole (REQUIP) 0.5 MG tablet Take 0.5 mg by mouth 3 (three) times daily.   Yes [provider]  terbinafine (LAMISIL) 250 MG tablet Take 250 mg by mouth daily.   Yes [provider]  topiramate (TOPAMAX) 200 MG tablet Take 200 mg by mouth daily.   Yes [provider]  valACYclovir (VALTREX) 1000 MG tablet Take by mouth. 10/18/14  Yes [provider]    Family History Family History  Problem Relation Age of Onset   Diabetes Father     Social History Social History   Tobacco Use   Smoking status: Never   Smokeless tobacco: Never  Substance Use Topics   Alcohol use: No   Drug use: No     Allergies   Morphine, Morphine and related, Sulfa antibiotics, Other, Sudafed [pseudoephedrine hcl], and Sulfamethoxazole-trimethoprim   Review of Systems Review of Systems  Constitutional:  Positive for fever. Negative for activity change and appetite change.  HENT:  Positive for congestion, ear discharge, ear pain, postnasal drip and rhinorrhea.   Respiratory:  Positive for cough. Negative for shortness of breath and wheezing.   Gastrointestinal:  Negative for diarrhea, nausea and vomiting.  Hematological: Negative.   Psychiatric/Behavioral: Negative.      Physical Exam Triage Vital Signs ED Triage Vitals  Enc Vitals Group     BP 01/12/21 1404 (!) 144/94     Pulse Rate 01/12/21 1404 (!) 105     Resp 01/12/21 1404 18     Temp 01/12/21 1404 98.1 F (36.7 C)     Temp Source  01/12/21 1404 Oral     SpO2 01/12/21 1404 99 %     Weight --      Height --      Head Circumference --      Peak Flow --      Pain Score 01/12/21 1405 3     Pain Loc --      Pain Edu? --      Excl. in GC? --    No data found.  Updated Vital Signs BP (!) 144/94 (BP Location: Left Arm)   Pulse (!) 105   Temp 98.1 F (36.7 C) (Oral)   Resp 18   SpO2 99%   Visual Acuity Right Eye Distance:   Left Eye Distance:   Bilateral Distance:    Right Eye Near:   Left Eye Near:    Bilateral Near:     Physical Exam Vitals and nursing note reviewed.  Constitutional:  General: She is not in acute distress.    Appearance: Normal appearance. She is not ill-appearing.  HENT:     Head: Normocephalic and atraumatic.     Right Ear: Ear canal and external ear normal. There is no impacted cerumen.     Left Ear: Ear canal and external ear normal. There is no impacted cerumen.     Ears:     Comments: Bilateral tympanic membranes are erythematous and injected, right greater than left.    Nose: Congestion and rhinorrhea present.     Mouth/Throat:     Mouth: Mucous membranes are moist.     Pharynx: Oropharynx is clear. Posterior oropharyngeal erythema present.  Cardiovascular:     Rate and Rhythm: Normal rate and regular rhythm.     Pulses: Normal pulses.     Heart sounds: Normal heart sounds. No murmur heard.   No gallop.  Pulmonary:     Effort: Pulmonary effort is normal.     Breath sounds: Normal breath sounds. No wheezing, rhonchi or rales.  Musculoskeletal:     Cervical back: Normal range of motion and neck supple.  Lymphadenopathy:     Cervical: No cervical adenopathy.  Skin:    General: Skin is warm and dry.     Capillary Refill: Capillary refill takes less than 2 seconds.     Findings: No erythema or rash.  Neurological:     General: No focal deficit present.     Mental Status: She is alert and oriented to person, place, and time.  Psychiatric:        Mood and Affect:  Mood normal.        Behavior: Behavior normal.        Thought Content: Thought content normal.        Judgment: Judgment normal.     UC Treatments / Results  Labs (all labs ordered are listed, but only abnormal results are displayed) Labs Reviewed - No data to display  EKG   Radiology No results found.  Procedures Procedures (including critical care time)  Medications Ordered in UC Medications - No data to display  Initial Impression / Assessment and Plan / UC Course  I have reviewed the triage vital signs and the nursing notes.  Pertinent labs & imaging results that were available during my care of the patient were reviewed by me and considered in my medical decision making (see chart for details).  Appearing 40 year old female here for evaluation of respiratory complaints as outlined in HPI above.  Patient's physical exam reveals erythematous and injected tympanic membranes, R>L, with clear external auditory canals.  Nasal mucosa is erythematous and markedly edematous with purulent discharge in both nares.  Oropharyngeal exam reveals mild posterior oropharyngeal erythema with yellow postnasal drip.  No cervical lymphadenopathy appreciated on exam.  Cardiopulmonary exam reveals clear lung sounds in all fields.  Patient exam is consistent with a URI and otitis media.  We will treat with Augmentin twice daily for 10 days for the otitis media and Atrovent nasal spray, Tessalon Perles, promethazine DM cough syrup for cough and congestion.  Work note provided.   Final Clinical Impressions(s) / UC Diagnoses   Final diagnoses:  Upper respiratory tract infection, unspecified type  Non-recurrent acute suppurative otitis media of both ears without spontaneous rupture of tympanic membranes     Discharge Instructions      Take the Augmentin twice daily for 10 days with food for treatment of your ear infection.  Take an over-the-counter probiotic  1 hour after each dose of antibiotic  to prevent diarrhea.  Use over-the-counter Tylenol and ibuprofen as needed for pain or fever.  Place a hot water bottle, or heating pad, underneath your pillowcase at night to help dilate up your ear and aid in pain relief as well as resolution of the infection.  Use the Atrovent nasal spray, 2 squirts in each nostril every 6 hours, as needed for runny nose and postnasal drip.  Use the Tessalon Perles every 8 hours during the day.  Take them with a small sip of water.  They may give you some numbness to the base of your tongue or a metallic taste in your mouth, this is normal.  Use the Promethazine DM cough syrup at bedtime for cough and congestion.  It will make you drowsy so do not take it during the day.  Return for reevaluation or see your primary care provider for any new or worsening symptoms.        ED Prescriptions     Medication Sig Dispense Auth. Provider   amoxicillin-clavulanate (AUGMENTIN) 875-125 MG tablet Take 1 tablet by mouth every 12 (twelve) hours for 10 days. 20 tablet Becky Augusta, NP   benzonatate (TESSALON) 100 MG capsule Take 2 capsules (200 mg total) by mouth every 8 (eight) hours. 21 capsule Becky Augusta, NP   ipratropium (ATROVENT) 0.06 % nasal spray Place 2 sprays into both nostrils 4 (four) times daily. 15 mL Becky Augusta, NP   promethazine-dextromethorphan (PROMETHAZINE-DM) 6.25-15 MG/5ML syrup Take 5 mLs by mouth 4 (four) times daily as needed. 118 mL Becky Augusta, NP      PDMP not reviewed this encounter.   Becky Augusta, NP 01/12/21 1504

## 2021-01-12 NOTE — ED Triage Notes (Signed)
Pt presents with having a low grade temp, cough, congestion 5 days ago.  Temp resolved after one day but is having R ear pain and drainage and starting to have some fullness in L ear.  Reports ongoing post nasal drip. Mucinex taken at home as well as using ear drips for tinnitus/pain.  Home COVID negative.

## 2021-11-12 ENCOUNTER — Other Ambulatory Visit: Payer: Self-pay | Admitting: Otolaryngology

## 2021-11-12 ENCOUNTER — Other Ambulatory Visit (HOSPITAL_COMMUNITY): Payer: Self-pay | Admitting: Otolaryngology

## 2021-11-12 DIAGNOSIS — R221 Localized swelling, mass and lump, neck: Secondary | ICD-10-CM

## 2021-11-18 ENCOUNTER — Ambulatory Visit
Admission: RE | Admit: 2021-11-18 | Discharge: 2021-11-18 | Disposition: A | Discharge status: Home / Self Care | Payer: Medicaid Other | Source: Ambulatory Visit | Attending: Otolaryngology | Admitting: Otolaryngology

## 2021-11-18 DIAGNOSIS — R221 Localized swelling, mass and lump, neck: Secondary | ICD-10-CM | POA: Diagnosis present

## 2021-11-18 MED ORDER — IOHEXOL 300 MG/ML  SOLN
75.0000 mL | Freq: Once | INTRAMUSCULAR | Status: AC | PRN
  Administered 2021-11-18: 75 mL via INTRAVENOUS

## 2022-01-29 IMAGING — CT CT ABD-PELV W/ CM
2 of 4 series · 17 of 46 positions shown, 19 images · IV contrast (omnipaque)
Comparison: None.

CLINICAL DATA: Right lower quadrant pain

EXAM:
CT ABDOMEN AND PELVIS WITH CONTRAST
TECHNIQUE: Multidetector CT imaging of the abdomen and pelvis was performed
using the standard protocol following bolus administration of
intravenous contrast.
CONTRAST:  100mL OMNIPAQUE IOHEXOL 350 MG/ML SOLN

[Series 2: routine abd/pel with · axial · 0.86mm/px · z∈[-402,+18]mm · 14 of 92 slices shown, 16 images]
[im 4/92  soft-tissue]
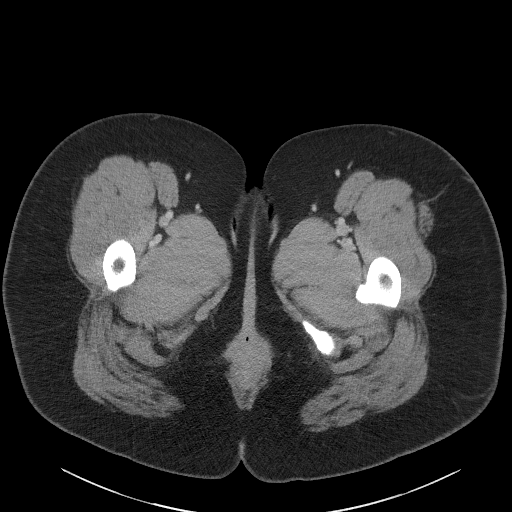
[im 4/92  bone]
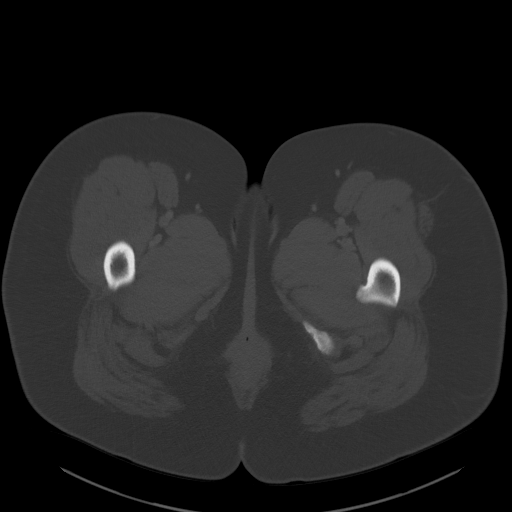
[im 12/92  soft-tissue]
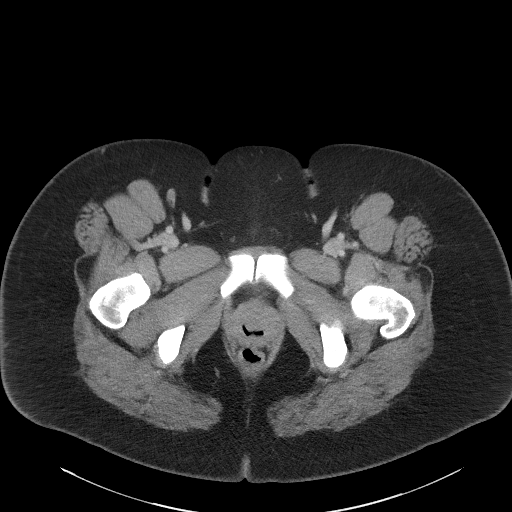
[im 19/92  soft-tissue]
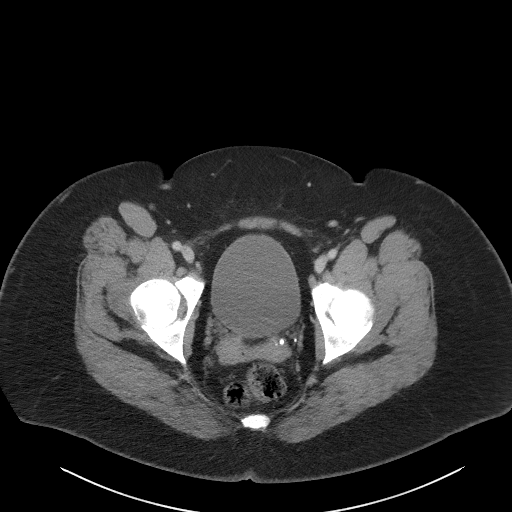
[im 23/92  soft-tissue]
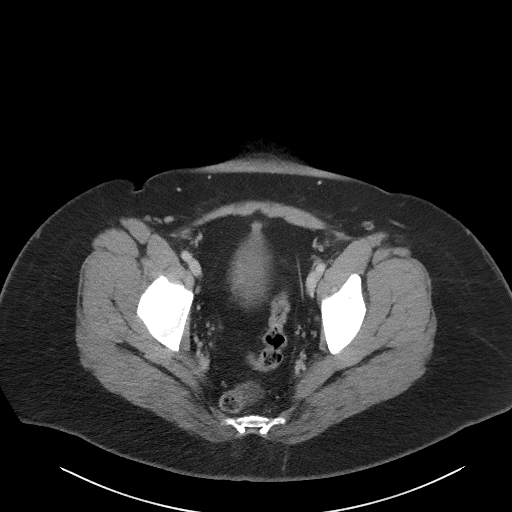
[im 31/92  soft-tissue]
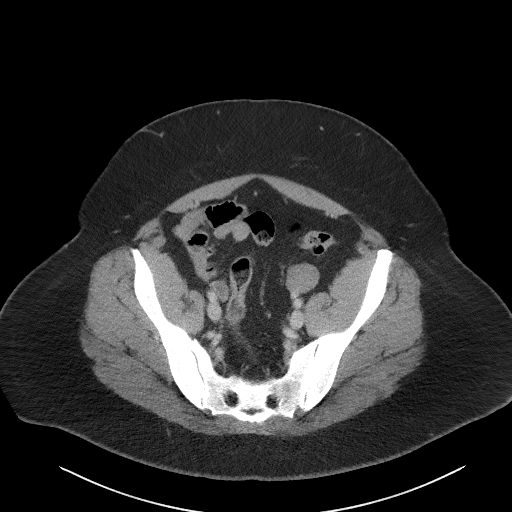
[im 38/92  soft-tissue]
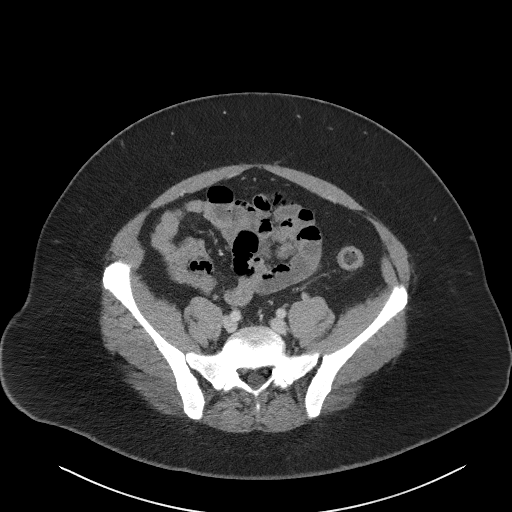
[im 42/92  soft-tissue]
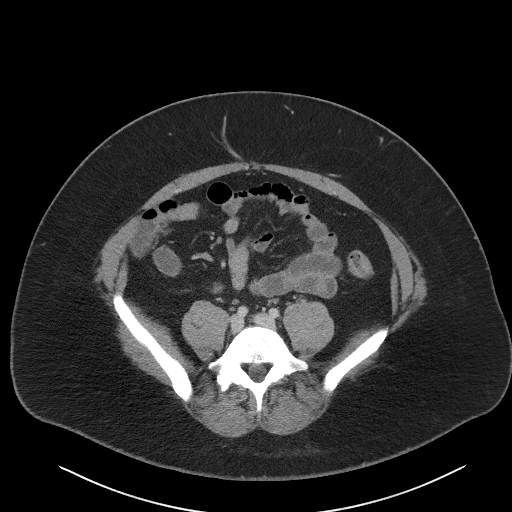
[im 50/92  soft-tissue]
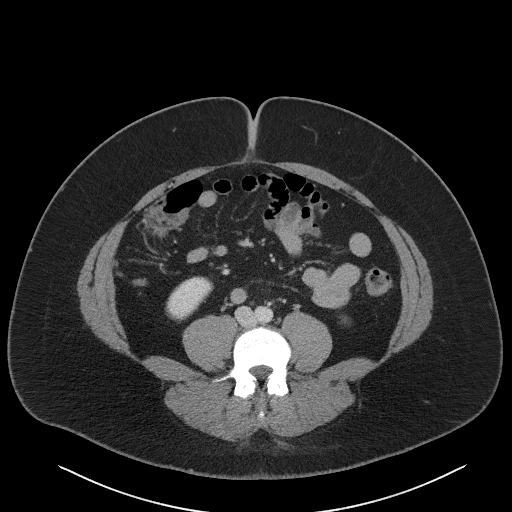
[im 54/92  soft-tissue]
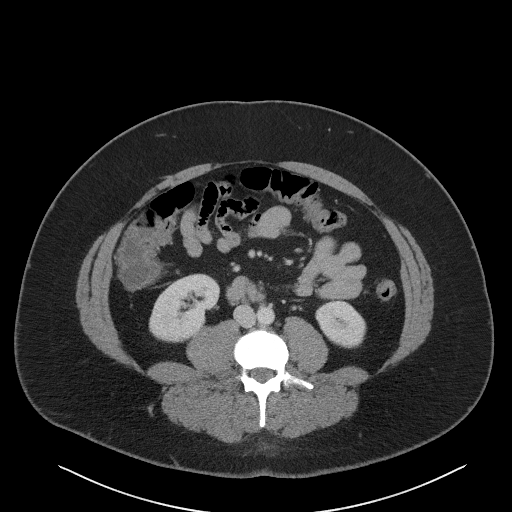
[im 54/92  bone]
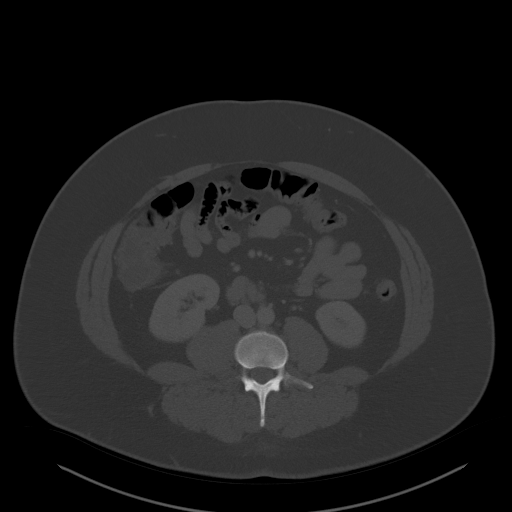
[im 61/92  soft-tissue]
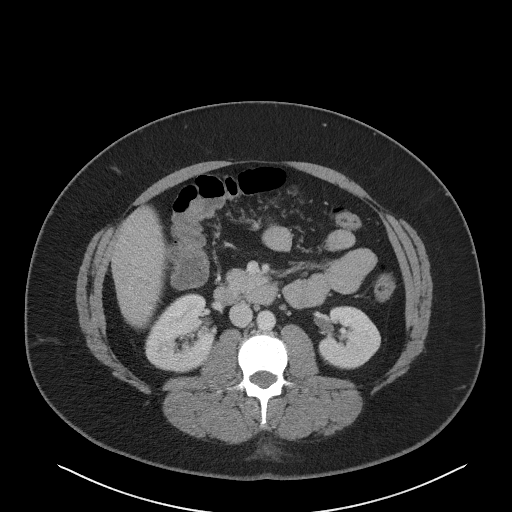
[im 69/92  soft-tissue]
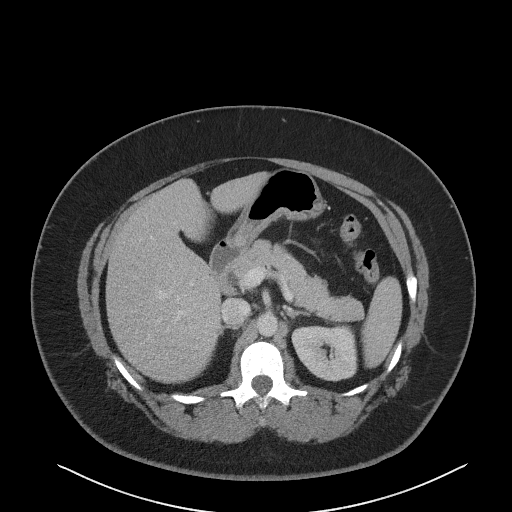
[im 73/92  soft-tissue]
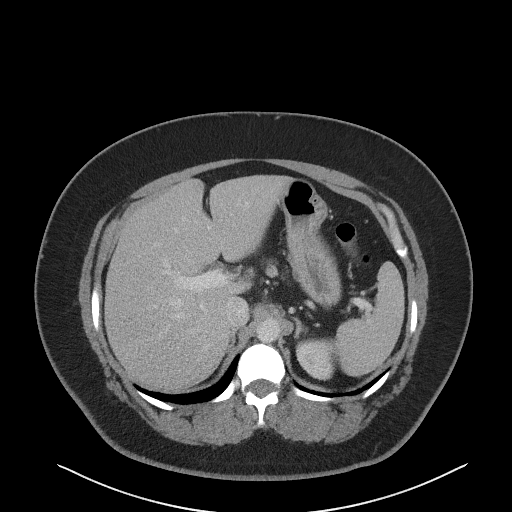
[im 80/92  soft-tissue]
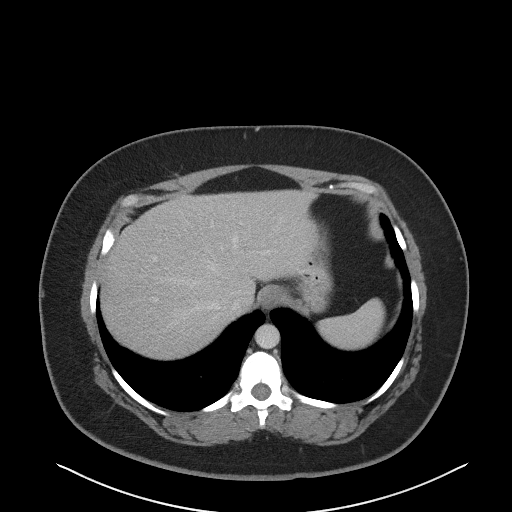
[im 88/92  soft-tissue]
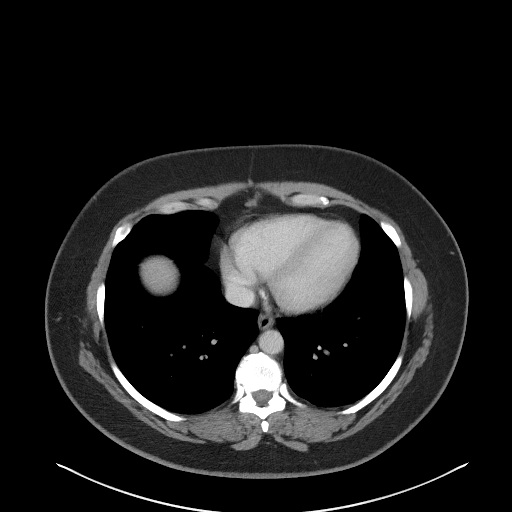

[Series 5: coronal st · coronal · 0.84mm/px · 3 of 112 slices shown]
[im 38/112  soft-tissue]
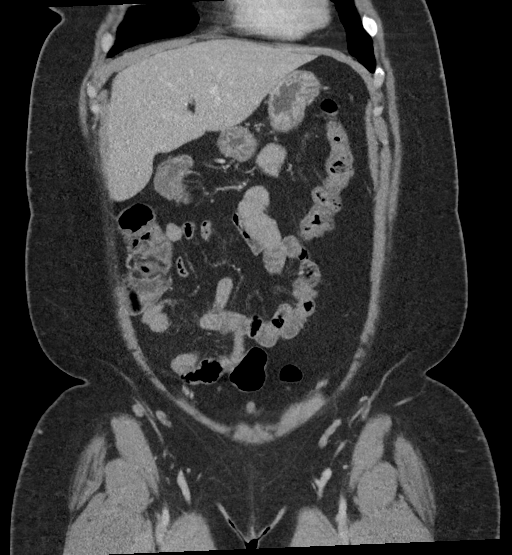
[im 50/112  soft-tissue]
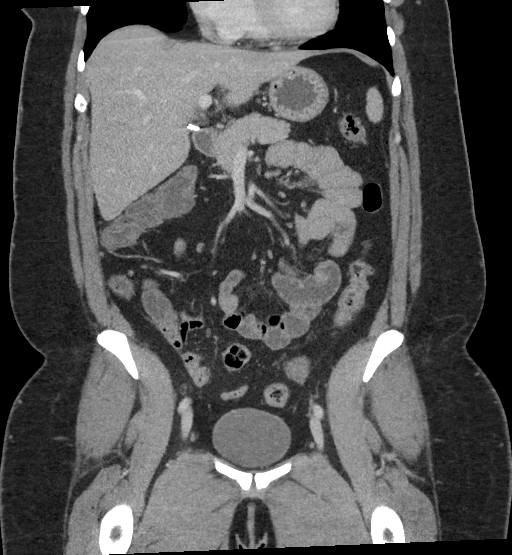
[im 62/112  soft-tissue]
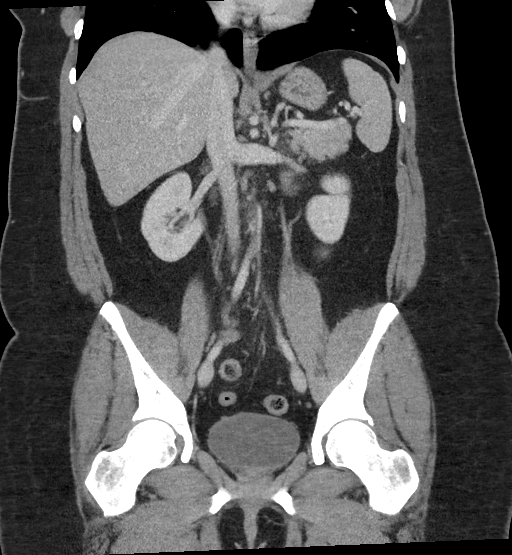

[17 of 46 positions shown; findings below may reference images not displayed]

FINDINGS: Lower chest: No acute abnormality.

Hepatobiliary: No focal liver abnormality is seen. Status post
cholecystectomy. No biliary dilatation.

Pancreas: Unremarkable. No pancreatic ductal dilatation or
surrounding inflammatory changes.

Spleen: Unremarkable.

Adrenals/Urinary Tract: Adrenals are unremarkable. Kidneys and
partially distended bladder are unremarkable.

Stomach/Bowel: Stomach is within normal limits. Bowel is normal in
caliber. Normal appendix.

Vascular/Lymphatic: No significant vascular abnormality. No enlarged
lymph nodes.

Reproductive: Status post hysterectomy. No adnexal masses.

Other: No free fluid.  Abdominal wall is unremarkable.

Musculoskeletal: Lower lumbar degenerative changes. No acute osseous
abnormality.
IMPRESSION: No acute abnormality.  Normal appendix.

## 2022-03-02 ENCOUNTER — Ambulatory Visit
Admission: EM | Admit: 2022-03-02 | Discharge: 2022-03-02 | Disposition: A | Discharge status: Home / Self Care | Payer: Medicaid Other

## 2022-03-02 DIAGNOSIS — R519 Headache, unspecified: Secondary | ICD-10-CM | POA: Diagnosis not present

## 2022-03-02 MED ORDER — PROMETHAZINE HCL 25 MG PO TABS: 25.0000 mg | ORAL_TABLET | Freq: Four times a day (QID) | ORAL | 0 refills | Status: AC | PRN

## 2022-03-02 MED ORDER — PREDNISONE 20 MG PO TABS: 40.0000 mg | ORAL_TABLET | Freq: Every day | ORAL | 0 refills | Status: DC

## 2022-03-02 MED ORDER — DEXAMETHASONE SODIUM PHOSPHATE 10 MG/ML IJ SOLN
10.0000 mg | Freq: Once | INTRAMUSCULAR | Status: AC
  Administered 2022-03-02: 10 mg via INTRAMUSCULAR

## 2022-03-02 MED ORDER — PROMETHAZINE HCL 25 MG PO TABS: 25.0000 mg | ORAL_TABLET | Freq: Four times a day (QID) | ORAL | 0 refills | Status: DC | PRN

## 2022-03-02 MED ORDER — PREDNISONE 20 MG PO TABS: 40.0000 mg | ORAL_TABLET | Freq: Every day | ORAL | 0 refills | Status: AC

## 2022-03-02 NOTE — ED Provider Notes (Signed)
Renaldo Fiddler    CSN: [id number] Arrival date & time: 03/02/22  1924      History   Chief Complaint Chief Complaint  Patient presents with   Migraine    Entered by patient    HPI Amber Ochoa is a 41 y.o. female.    Migraine    Presents to urgent care with complaint of headache that she describes as a migraine.  Symptoms starting 2 days ago.  She states pain is in the back of her head and is accompanied with stiff neck.  She denies fever denies any other symptoms.  She has photophobia.  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 The patient had CPAP applied. The maximal observed pressure was: 11 cm H2O. The recommended pressure is: 11-16 cm H2O.  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   LLQ pain 11/07/2013   Carpal tunnel syndrome of right wrist 08/08/2013   Tommi Rumps Quervain's disease (radial styloid tenosynovitis) 05/10/2013   Ganglion cyst of wrist  04/26/2013   Chronic pelvic pain in female 05/23/2012   History of sinus tachycardia 10/07/2011   Migraine 10/07/2011   Obesity, unspecified 10/07/2011   Moderate obstructive sleep apnea 09/23/2011   APL (antiphospholipid syndrome) (HCC) 11/05/2010   Metabolic syndrome 11/04/2010   Prediabetes 11/04/2010   Prehypertension 11/04/2010   Seasonal allergies 11/04/2010   Vitamin D deficiency disease 11/04/2010   Chronic asthma, mild persistent, uncomplicated 10/20/2010   Polycystic ovarian syndrome 10/20/2010    Past Surgical History:  Procedure Laterality Date   ABDOMINAL HYSTERECTOMY     GALLBLADDER SURGERY     Tendonitis Right     OB History   No obstetric history on file.      Home Medications    Prior to Admission medications   Medication Sig Start Date End Date Taking? Authorizing Provider  albuterol (PROVENTIL HFA;VENTOLIN HFA) 108 (90 BASE) MCG/ACT inhaler Inhale into the lungs every 6 (six) hours as needed for wheezing or shortness of breath.    [provider]  albuterol (PROVENTIL HFA;VENTOLIN HFA) 108 (90 Base) MCG/ACT inhaler Inhale 2 puffs into the lungs every 6 (six) hours as needed for wheezing or shortness of breath. 09/14/17   Willy Eddy, MD  amitriptyline (ELAVIL) 10 MG tablet Take 10 mg by mouth at bedtime. 2 at night    [provider]  aspirin EC 81 MG tablet Take 81 mg by mouth  daily.    [provider]  benzonatate (TESSALON) 100 MG capsule Take 2 capsules (200 mg total) by mouth every 8 (eight) hours. 01/12/21   Becky Augusta, NP  Fluticasone-Umeclidin-Vilant (TRELEGY ELLIPTA) 200-62.5-25 MCG/INH AEPB Inhale into the lungs.    [provider]  ipratropium (ATROVENT) 0.06 % nasal spray Place 2 sprays into both nostrils 4 (four) times daily. 01/12/21   Becky Augusta, NP  metFORMIN (GLUMETZA) 500 MG (MOD) 24 hr tablet Take 500 mg by mouth daily with breakfast. 4 tablets at night    [provider]   promethazine-dextromethorphan (PROMETHAZINE-DM) 6.25-15 MG/5ML syrup Take 5 mLs by mouth 4 (four) times daily as needed. 01/12/21   Becky Augusta, NP  rOPINIRole (REQUIP) 0.5 MG tablet Take 0.5 mg by mouth 3 (three) times daily.    [provider]  terbinafine (LAMISIL) 250 MG tablet Take 250 mg by mouth daily.    [provider]  topiramate (TOPAMAX) 200 MG tablet Take 200 mg by mouth daily.    [provider]  valACYclovir (VALTREX) 1000 MG tablet Take by mouth. 10/18/14   [provider]    Family History Family History  Problem Relation Age of Onset   Diabetes Father     Social History Social History   Tobacco Use   Smoking status: Never   Smokeless tobacco: Never  Substance Use Topics   Alcohol use: No   Drug use: No     Allergies   Morphine, Morphine and related, Sulfa antibiotics, Other, Sudafed [pseudoephedrine hcl], and Sulfamethoxazole-trimethoprim   Review of Systems Review of Systems   Physical Exam Triage Vital Signs ED Triage Vitals  Enc Vitals Group     BP      Pulse      Resp      Temp      Temp src      SpO2      Weight      Height      Head Circumference      Peak Flow      Pain Score      Pain Loc      Pain Edu?      Excl. in [place]?    No data found.  Updated Vital Signs There were no vitals taken for this visit.  Visual Acuity Right Eye Distance:   Left Eye Distance:   Bilateral Distance:    Right Eye Near:   Left Eye Near:    Bilateral Near:     Physical Exam Vitals reviewed.  Constitutional:      Appearance: Normal appearance.  Skin:    General: Skin is warm and dry.  Neurological:     General: No focal deficit present.     Mental Status: She is alert and oriented to person, place, and time.  Psychiatric:        Mood and Affect: Mood normal.        Behavior: Behavior normal.      UC Treatments / Results  Labs (all labs ordered are listed, but only abnormal results are  displayed) Labs Reviewed - No data to display  EKG   Radiology No results found.  Procedures Procedures (including critical care time)  Medications Ordered in UC Medications - No data to display  Initial Impression / Assessment and Plan / UC Course  I have reviewed the triage vital signs and the nursing notes.  Pertinent labs & imaging results that were available during my care of the  patient were reviewed by me and considered in my medical decision making (see chart for details).   Treating for likely tension headache with Decadron given in clinic.  She has nausea so will give promethazine for home use which will hopefully also induce sleep.  Giving additional doses of prednisone to start tomorrow.  Patient will follow-up in ED if symptoms do not resolve.   Final Clinical Impressions(s) / UC Diagnoses   Final diagnoses:  None   Discharge Instructions   None    ED Prescriptions   None    PDMP not reviewed this encounter.   Charma Igo, [place] 03/02/22 2021

## 2022-03-02 NOTE — Discharge Instructions (Signed)
Follow up here or with your primary care provider if your symptoms are worsening or not improving with treatment.     

## 2022-03-02 NOTE — ED Triage Notes (Signed)
Pt. Presents to UC w/ c/o a migraine for the past two days.

## 2022-05-07 ENCOUNTER — Ambulatory Visit
Admission: EM | Admit: 2022-05-07 | Discharge: 2022-05-07 | Disposition: A | Discharge status: Home / Self Care | Payer: Medicaid Other | Attending: Emergency Medicine | Admitting: Emergency Medicine

## 2022-05-07 DIAGNOSIS — H9202 Otalgia, left ear: Secondary | ICD-10-CM

## 2022-05-07 DIAGNOSIS — M545 Low back pain, unspecified: Secondary | ICD-10-CM

## 2022-05-07 LAB — POCT URINALYSIS DIP (MANUAL ENTRY)
Bilirubin, UA: NEGATIVE
Glucose, UA: NEGATIVE mg/dL
Ketones, POC UA: NEGATIVE mg/dL
Leukocytes, UA: NEGATIVE
Nitrite, UA: NEGATIVE
Protein Ur, POC: NEGATIVE mg/dL
Spec Grav, UA: 1.02 (ref 1.010–1.025)
Urobilinogen, UA: 0.2 E.U./dL
pH, UA: 7.5 (ref 5.0–8.0)

## 2022-05-07 MED ORDER — METHOCARBAMOL 500 MG PO TABS: 500.0000 mg | ORAL_TABLET | Freq: Two times a day (BID) | ORAL | 0 refills | Status: AC | PRN

## 2022-05-07 NOTE — ED Triage Notes (Signed)
Patient to Urgent Care with complaints of left sided ear pain that radiates into her jaw and neck. Reports seeing some bloody drainage. Symptoms started today.  Also reports some right sided/ sharp lower back pain that radiates into her groin. Denies any injury. Denies any urinary symptoms or vaginal discharge. Symptoms started today.

## 2022-05-07 NOTE — Discharge Instructions (Addendum)
Take Tylenol or ibuprofen as needed for discomfort.    Take the muscle relaxer as needed for muscle spasm; Do not drive, operate machinery, or drink alcohol with this medication as it can cause drowsiness.   Follow up with your primary care provider if your symptoms are not improving.

## 2022-05-07 NOTE — ED Provider Notes (Signed)
Roderic Palau    CSN: [id number] Arrival date & time: 05/07/22  [place]      History   Chief Complaint Chief Complaint  Patient presents with   Otalgia   Back Pain    HPI Amber Ochoa is a 42 y.o. female.  Patient presents with left ear pain and drainage today.  No injury.  She also reports right low back pain today.  No falls or injury.  The pain is nonradiating; worse with movement; improves with rest.  She denies fever, chills, rash, sore throat, cough, shortness of breath, chest pain, abdominal pain, dysuria, or other symptoms.  No treatment at home.      The history is provided by the patient and medical records.    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 The patient had CPAP applied. The maximal observed pressure was: 11 cm H2O. The recommended pressure is: 11-16 cm H2O.  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   LLQ pain 11/07/2013   Carpal tunnel  syndrome of right wrist 08/08/2013   Tennis Must Quervain's disease (radial styloid tenosynovitis) 05/10/2013   Ganglion cyst of wrist 04/26/2013   Chronic pelvic pain in female 05/23/2012   History of sinus tachycardia 10/07/2011   Migraine 10/07/2011   Obesity, unspecified 10/07/2011   Moderate obstructive sleep apnea 09/23/2011   APL (antiphospholipid syndrome) ([place]) [id number]   Metabolic syndrome [id number]   Prediabetes 11/04/2010   Prehypertension 11/04/2010   Seasonal allergies 11/04/2010   Vitamin D deficiency disease 11/04/2010   Chronic asthma, mild persistent, uncomplicated [id number]   Polycystic ovarian syndrome 10/20/2010    Past Surgical History:  Procedure Laterality Date   ABDOMINAL HYSTERECTOMY     DRUG INDUCED ENDOSCOPY     oct 2022   GALLBLADDER SURGERY     Tendonitis Right    TONSILLECTOMY      OB History   No obstetric history on file.      Home Medications    Prior to Admission medications   Medication Sig Start Date End Date Taking? Authorizing Provider  methocarbamol (ROBAXIN) 500 MG tablet Take 1 tablet (500 mg total) by mouth 2 (two) times daily as needed for muscle spasms. 05/07/22  Yes Sharion Balloon, NP  naltrexone (DEPADE) 50 MG tablet Take 1 tablet by mouth daily. 04/07/22 04/07/23 Yes [provider]  albuterol (PROVENTIL HFA;VENTOLIN HFA) 108 (90 BASE) MCG/ACT inhaler Inhale into the  lungs every 6 (six) hours as needed for wheezing or shortness of breath.    [provider]  albuterol (PROVENTIL HFA;VENTOLIN HFA) 108 (90 Base) MCG/ACT inhaler Inhale 2 puffs into the lungs every 6 (six) hours as needed for wheezing or shortness of breath. 09/14/17   Merlyn Lot, MD  amitriptyline (ELAVIL) 10 MG tablet Take 10 mg by mouth at bedtime. 2 at night    [provider]  amoxicillin-clavulanate (AUGMENTIN) 400-57 MG/5ML suspension SMARTSIG:10.9 Milliliter(s) By Mouth Every 12 Hours Patient not taking: Reported on 05/07/2022 11/10/21    [provider]  Armodafinil (NUVIGIL) 250 MG tablet Nuvigil 250 mg tablet  TAKE 1 TABLET (250 MG TOTAL) BY MOUTH ONCE DAILY AS NEEDED Patient not taking: Reported on 05/07/2022    [provider]  aspirin EC 81 MG tablet Take 81 mg by mouth daily.    [provider]  benzonatate (TESSALON) 100 MG capsule Take 2 capsules (200 mg total) by mouth every 8 (eight) hours. Patient not taking: Reported on 05/07/2022 01/12/21   Margarette Canada, NP  clindamycin (CLEOCIN) 300 MG capsule Take 600 mg by mouth 3 (three) times daily. Patient not taking: Reported on 05/07/2022 11/26/21   [provider]  doxycycline (VIBRAMYCIN) 100 MG capsule Take 100 mg by mouth 2 (two) times daily. Patient not taking: Reported on 05/07/2022 11/20/21   [provider]  Fluticasone-Umeclidin-Vilant (TRELEGY ELLIPTA) 200-62.5-25 MCG/INH AEPB Inhale into the lungs.    [provider]  gabapentin (NEURONTIN) 300 MG/6ML solution Take by mouth. Patient not taking: Reported on 05/07/2022 11/04/21   [provider]  ibuprofen (ADVIL) 600 MG tablet Take 1 tablet 3 times a day by oral route.    [provider]  ipratropium (ATROVENT) 0.06 % nasal spray Place 2 sprays into both nostrils 4 (four) times daily. 01/12/21   Margarette Canada, NP  lidocaine (LIDODERM) 5 % PLEASE SEE ATTACHED FOR DETAILED DIRECTIONS Patient not taking: Reported on 05/07/2022    [provider]  meloxicam (MOBIC) 15 MG tablet TAKE 1 TABLET BY MOUTH EVERY DAY WITH MEALS    [provider]  metFORMIN (GLUMETZA) 500 MG (MOD) 24 hr tablet Take 500 mg by mouth daily with breakfast. 4 tablets at night    [provider]  oxyCODONE (ROXICODONE) 5 MG/5ML solution SMARTSIG:5-10 Milliliter(s) By Mouth Every 6 Hours PRN Patient not taking: Reported on 05/07/2022 11/04/21   [provider]  pregabalin (LYRICA) 75 MG capsule Lyrica 75 mg capsule  TAKE 1 CAPSULE (75 MG TOTAL) BY MOUTH  NIGHTLY Patient not taking: Reported on 05/07/2022    [provider]  promethazine (PHENERGAN) 25 MG tablet Take 1 tablet (25 mg total) by mouth every 6 (six) hours as needed for nausea or vomiting. Patient not taking: Reported on 05/07/2022 03/02/22   Immordino, Annie Main, FNP  promethazine-dextromethorphan (PROMETHAZINE-DM) 6.25-15 MG/5ML syrup Take 5 mLs by mouth 4 (four) times daily as needed. Patient not taking: Reported on 05/07/2022 01/12/21   Margarette Canada, NP  rOPINIRole (REQUIP) 0.5 MG tablet Take 0.5 mg by mouth 3 (three) times daily.    [provider]  terbinafine (LAMISIL) 250 MG tablet Take 250 mg by mouth daily.    [provider]  topiramate (TOPAMAX) 200 MG tablet Take 200 mg by mouth daily. Patient not taking: Reported on 05/07/2022    [provider]  TRANSDERM-SCOP 1 MG/3DAYS SMARTSIG:Topical 11/04/21   [provider]  valACYclovir (VALTREX) 1000 MG tablet Take by mouth. 10/18/14  [provider]  venlafaxine XR (EFFEXOR-XR) 37.5 MG 24 hr capsule venlafaxine ER 37.5 mg capsule,extended release 24 hr  TAKE 1 CAPSULE BY MOUTH EVERY MORNING FOR 1 WEEK,THEN INCREASE TO 2 CAPSULES EVERY MORNING    [provider]    Family History Family History  Problem Relation Age of Onset   Diabetes Father     Social History Social History   Tobacco Use   Smoking status: Never   Smokeless tobacco: Never  Substance Use Topics   Alcohol use: No   Drug use: No     Allergies   Morphine, Morphine and related, Sulfa antibiotics, Other, Sudafed [pseudoephedrine hcl], and Sulfamethoxazole-trimethoprim   Review of Systems Review of Systems  Constitutional:  Negative for chills and fever.  HENT:  Positive for ear discharge and ear pain. Negative for sore throat.   Respiratory:  Negative for cough and shortness of breath.   Cardiovascular:  Negative for chest pain and palpitations.  Gastrointestinal:  Negative for abdominal pain,  diarrhea, nausea and vomiting.  Genitourinary:  Negative for dysuria and hematuria.  Musculoskeletal:  Positive for back pain. Negative for arthralgias, gait problem and joint swelling.  Skin:  Negative for color change and rash.  Neurological:  Negative for weakness and numbness.  All other systems reviewed and are negative.    Physical Exam Triage Vital Signs ED Triage Vitals  Enc Vitals Group     BP      Pulse      Resp      Temp      Temp src      SpO2      Weight      Height      Head Circumference      Peak Flow      Pain Score      Pain Loc      Pain Edu?      Excl. in [place]?    No data found.  Updated Vital Signs BP 132/84   Pulse 87   Temp 98.9 F (37.2 C)   Resp 18   SpO2 97%   Visual Acuity Right Eye Distance:   Left Eye Distance:   Bilateral Distance:    Right Eye Near:   Left Eye Near:    Bilateral Near:     Physical Exam Vitals and nursing note reviewed.  Constitutional:      General: She is not in acute distress.    Appearance: She is well-developed. She is not ill-appearing.  HENT:     Right Ear: Tympanic membrane and ear canal normal.     Left Ear: Tympanic membrane and ear canal normal.     Nose: Nose normal.     Mouth/Throat:     Mouth: Mucous membranes are moist.     Pharynx: Oropharynx is clear.  Cardiovascular:     Rate and Rhythm: Normal rate and regular rhythm.     Heart sounds: Normal heart sounds.  Pulmonary:     Effort: Pulmonary effort is normal. No respiratory distress.     Breath sounds: Normal breath sounds.  Abdominal:     General: Bowel sounds are normal.     Palpations: Abdomen is soft.     Tenderness: There is no abdominal tenderness. There is no right CVA tenderness, left CVA tenderness, guarding or rebound.  Musculoskeletal:        General: No swelling, tenderness, deformity or signs of injury. Normal range of motion.     Cervical  back: Neck supple.  Skin:    General: Skin is warm and dry.     Capillary  Refill: Capillary refill takes less than 2 seconds.     Findings: No bruising, erythema, lesion or rash.  Neurological:     General: No focal deficit present.     Mental Status: She is alert and oriented to person, place, and time.     Sensory: No sensory deficit.     Motor: No weakness.     Gait: Gait normal.  Psychiatric:        Mood and Affect: Mood normal.        Behavior: Behavior normal.      UC Treatments / Results  Labs (all labs ordered are listed, but only abnormal results are displayed) Labs Reviewed  POCT URINALYSIS DIP (MANUAL ENTRY) - Abnormal; Notable for the following components:      Result Value   Blood, UA trace-intact (*)    All other components within normal limits    EKG   Radiology No results found.  Procedures Procedures (including critical care time)  Medications Ordered in UC Medications - No data to display  Initial Impression / Assessment and Plan / UC Course  I have reviewed the triage vital signs and the nursing notes.  Pertinent labs & imaging results that were available during my care of the patient were reviewed by me and considered in my medical decision making (see chart for details).   Left otalgia. Right lower back pain.  No sign of urine infection.  Exam is reassuring.  Vital signs are stable.  Discussed Tylenol or ibuprofen as needed for discomfort.  Treating with Robaxin; precautions for drowsiness with this medication discussed.  Education provided on otalgia and on back pain.  Instructed patient to follow up with her PCP if her symptoms are not improving.  She agrees to plan of care.     Final Clinical Impressions(s) / UC Diagnoses   Final diagnoses:  Acute otalgia, left  Acute right-sided low back pain without sciatica     Discharge Instructions      Take Tylenol or ibuprofen as needed for discomfort.    Take the muscle relaxer as needed for muscle spasm; Do not drive, operate machinery, or drink alcohol with this  medication as it can cause drowsiness.   Follow up with your primary care provider if your symptoms are not improving.         ED Prescriptions     Medication Sig Dispense Auth. Provider   methocarbamol (ROBAXIN) 500 MG tablet Take 1 tablet (500 mg total) by mouth 2 (two) times daily as needed for muscle spasms. 10 tablet Sharion Balloon, NP      PDMP not reviewed this encounter.   Sharion Balloon, NP 05/07/22 1905

## 2024-02-04 ENCOUNTER — Ambulatory Visit
Admission: EM | Admit: 2024-02-04 | Discharge: 2024-02-04 | Disposition: A | Discharge status: Home / Self Care | Attending: Emergency Medicine | Admitting: Emergency Medicine

## 2024-02-04 ENCOUNTER — Encounter: Payer: Self-pay | Admitting: Emergency Medicine

## 2024-02-04 DIAGNOSIS — H6501 Acute serous otitis media, right ear: Secondary | ICD-10-CM

## 2024-02-04 MED ORDER — PROMETHAZINE-DM 6.25-15 MG/5ML PO SYRP: 5.0000 mL | ORAL_SOLUTION | Freq: Every evening | ORAL | 0 refills | Status: AC | PRN

## 2024-02-04 MED ORDER — AMOXICILLIN-POT CLAVULANATE 875-125 MG PO TABS: 1.0000 | ORAL_TABLET | Freq: Two times a day (BID) | ORAL | 0 refills | Status: AC

## 2024-02-04 MED ORDER — BENZONATATE 100 MG PO CAPS: 100.0000 mg | ORAL_CAPSULE | Freq: Three times a day (TID) | ORAL | 0 refills | Status: AC

## 2024-02-04 NOTE — ED Provider Notes (Signed)
 Amber Ochoa    CSN: [id number] Arrival date & time: 02/04/24  0818      History   Chief Complaint Chief Complaint  Patient presents with   Cough   Nasal Congestion   Otalgia    HPI Amber Ochoa is a 43 y.o. female.   Patient presents for evaluation of nasal congestion, productive cough with yellow sputum, wheezing with cough, bilateral ear pain present for 7 days.  Has experienced a headache but unsure if related to chronic migraines or current symptoms.  Tolerable to food and liquids.  No known sick contacts prior.  Has attempted use of Vicks and NyQuil.  Denies fever or shortness of breath.    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 The patient had CPAP applied. The maximal observed pressure was: 11 cm H2O. The recommended pressure is: 11-16 cm H2O.  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   LLQ pain 11/07/2013   Carpal tunnel syndrome of right wrist 08/08/2013    Everitt Quervain's disease (radial styloid tenosynovitis) 05/10/2013   Ganglion cyst of wrist 04/26/2013   Chronic pelvic pain in female 05/23/2012   History of sinus tachycardia 10/07/2011   Migraine 10/07/2011   Obesity, unspecified 10/07/2011   Moderate obstructive sleep apnea 09/23/2011   APL (antiphospholipid syndrome) 11/05/2010   Metabolic syndrome 11/04/2010   Prediabetes 11/04/2010   Prehypertension 11/04/2010   Seasonal allergies 11/04/2010   Vitamin D deficiency disease 11/04/2010   Chronic asthma, mild persistent, uncomplicated 10/20/2010   Polycystic ovarian syndrome 10/20/2010    Past Surgical History:  Procedure Laterality Date   ABDOMINAL HYSTERECTOMY     DRUG INDUCED ENDOSCOPY     oct 2022   GALLBLADDER SURGERY     Tendonitis Right    TONSILLECTOMY      OB History   No obstetric history on file.      Home Medications    Prior to Admission medications   Medication Sig Start Date End Date Taking? Authorizing Provider  albuterol  (PROVENTIL  HFA;VENTOLIN  HFA) 108 (90 BASE) MCG/ACT inhaler Inhale into the lungs every 6 (six) hours as needed for wheezing or shortness of breath.    [provider]  albuterol  (PROVENTIL  HFA;VENTOLIN  HFA) 108 (90 Base) MCG/ACT inhaler Inhale 2 puffs into the lungs every 6 (six) hours as needed for wheezing or shortness of breath. 09/14/17   Lang Dover, MD  amitriptyline (ELAVIL) 10 MG tablet Take  10 mg by mouth at bedtime. 2 at night    [provider]  amoxicillin -clavulanate (AUGMENTIN ) 400-57 MG/5ML suspension SMARTSIG:10.9 Milliliter(s) By Mouth Every 12 Hours Patient not taking: Reported on 05/07/2022 11/10/21   [provider]  Armodafinil (NUVIGIL) 250 MG tablet Nuvigil 250 mg tablet  TAKE 1 TABLET (250 MG TOTAL) BY MOUTH ONCE DAILY AS NEEDED Patient not taking: Reported on 05/07/2022    [provider]  aspirin EC 81 MG tablet Take 81 mg by mouth daily.    [provider]   benzonatate  (TESSALON ) 100 MG capsule Take 2 capsules (200 mg total) by mouth every 8 (eight) hours. Patient not taking: Reported on 05/07/2022 01/12/21   Bernardino Ditch, NP  clindamycin (CLEOCIN) 300 MG capsule Take 600 mg by mouth 3 (three) times daily. Patient not taking: Reported on 05/07/2022 11/26/21   [provider]  doxycycline (VIBRAMYCIN) 100 MG capsule Take 100 mg by mouth 2 (two) times daily. Patient not taking: Reported on 05/07/2022 11/20/21   [provider]  Fluticasone-Umeclidin-Vilant (TRELEGY ELLIPTA) 200-62.5-25 MCG/INH AEPB Inhale into the lungs.    [provider]  gabapentin (NEURONTIN) 300 MG/6ML solution Take by mouth. Patient not taking: Reported on 05/07/2022 11/04/21   [provider]  ibuprofen (ADVIL) 600 MG tablet Take 1 tablet 3 times a day by oral route.    [provider]  ipratropium (ATROVENT ) 0.06 % nasal spray Place 2 sprays into both nostrils 4 (four) times daily. 01/12/21   Bernardino Ditch, NP  lidocaine  (LIDODERM ) 5 % PLEASE SEE ATTACHED FOR DETAILED DIRECTIONS Patient not taking: Reported on 05/07/2022    [provider]  meloxicam (MOBIC) 15 MG tablet TAKE 1 TABLET BY MOUTH EVERY DAY WITH MEALS    [provider]  metFORMIN (GLUMETZA) 500 MG (MOD) 24 hr tablet Take 500 mg by mouth daily with breakfast. 4 tablets at night    [provider]  methocarbamol  (ROBAXIN ) 500 MG tablet Take 1 tablet (500 mg total) by mouth 2 (two) times daily as needed for muscle spasms. 05/07/22   Corlis Burnard DEL, NP  oxyCODONE (ROXICODONE) 5 MG/5ML solution SMARTSIG:5-10 Milliliter(s) By Mouth Every 6 Hours PRN Patient not taking: Reported on 05/07/2022 11/04/21   [provider]  pregabalin (LYRICA) 75 MG capsule Lyrica 75 mg capsule  TAKE 1 CAPSULE (75 MG TOTAL) BY MOUTH NIGHTLY Patient not taking: Reported on 05/07/2022    [provider]  promethazine  (PHENERGAN ) 25 MG tablet Take 1 tablet (25 mg total) by  mouth every 6 (six) hours as needed for nausea or vomiting. Patient not taking: Reported on 05/07/2022 03/02/22   Immordino, Garnette, FNP  promethazine -dextromethorphan (PROMETHAZINE -DM) 6.25-15 MG/5ML syrup Take 5 mLs by mouth 4 (four) times daily as needed. Patient not taking: Reported on 05/07/2022 01/12/21   Bernardino Ditch, NP  rOPINIRole (REQUIP) 0.5 MG tablet Take 0.5 mg by mouth 3 (three) times daily.    [provider]  terbinafine (LAMISIL) 250 MG tablet Take 250 mg by mouth daily.    [provider]  topiramate (TOPAMAX) 200 MG tablet Take 200 mg by mouth daily. Patient not taking: Reported on 05/07/2022    [provider]  TRANSDERM-SCOP 1 MG/3DAYS SMARTSIG:Topical 11/04/21   [provider]  valACYclovir (VALTREX) 1000 MG tablet Take by mouth. 10/18/14   [provider]  venlafaxine XR (EFFEXOR-XR) 37.5 MG 24 hr capsule venlafaxine ER 37.5 mg capsule,extended release 24 hr  TAKE 1 CAPSULE BY MOUTH EVERY MORNING  FOR 1 WEEK,THEN INCREASE TO 2 CAPSULES EVERY MORNING    [provider]    Family History Family History  Problem Relation Age of Onset   Diabetes Father     Social History Social History   Tobacco Use   Smoking status: Never   Smokeless tobacco: Never  Vaping Use   Vaping status: Never Used  Substance Use Topics   Alcohol use: No   Drug use: No     Allergies   Morphine, Morphine and codeine, Sulfa antibiotics, Other, Sudafed [pseudoephedrine hcl], and Sulfamethoxazole-trimethoprim   Review of Systems Review of Systems  Constitutional: Negative.   HENT:  Positive for congestion and ear pain. Negative for dental problem, drooling, ear discharge, facial swelling, hearing loss, mouth sores, nosebleeds, postnasal drip, rhinorrhea, sinus pressure, sinus pain, sneezing, sore throat, tinnitus, trouble swallowing and voice change.   Respiratory:  Positive for cough and wheezing. Negative for apnea, choking, chest  tightness, shortness of breath and stridor.   Cardiovascular: Negative.   Gastrointestinal: Negative.   Skin: Negative.   Neurological:  Positive for headaches. Negative for dizziness, tremors, seizures, syncope, facial asymmetry, speech difficulty, weakness, light-headedness and numbness.     Physical Exam Triage Vital Signs ED Triage Vitals  Encounter Vitals Group     BP 02/04/24 0857 (!) 143/85     Girls Systolic BP Percentile --      Girls Diastolic BP Percentile --      Boys Systolic BP Percentile --      Boys Diastolic BP Percentile --      Pulse Rate 02/04/24 0857 98     Resp 02/04/24 0857 18     Temp 02/04/24 0857 98.2 F (36.8 C)     Temp Source 02/04/24 0857 Oral     SpO2 02/04/24 0857 98 %     Weight --      Height --      Head Circumference --      Peak Flow --      Pain Score 02/04/24 0853 4     Pain Loc --      Pain Education --      Exclude from Growth Chart --    No data found.  Updated Vital Signs BP (!) 143/85 (BP Location: Left Arm)   Pulse 98   Temp 98.2 F (36.8 C) (Oral)   Resp 18   SpO2 98%   Visual Acuity Right Eye Distance:   Left Eye Distance:   Bilateral Distance:    Right Eye Near:   Left Eye Near:    Bilateral Near:     Physical Exam Constitutional:      Appearance: Normal appearance.  HENT:     Right Ear: Ear canal and external ear normal. Tympanic membrane is erythematous.     Left Ear: Hearing, tympanic membrane, ear canal and external ear normal.     Nose: Congestion present.     Mouth/Throat:     Pharynx: No oropharyngeal exudate or posterior oropharyngeal erythema.  Cardiovascular:     Rate and Rhythm: Normal rate and regular rhythm.     Pulses: Normal pulses.     Heart sounds: Normal heart sounds.  Pulmonary:     Effort: Pulmonary effort is normal.     Breath sounds: Normal breath sounds.  Neurological:     Mental Status: She is alert and oriented to person, place, and time. Mental status is at baseline.       UC Treatments / Results  Labs (all labs ordered are listed, but only abnormal results are displayed) Labs Reviewed - No data to display  EKG   Radiology No results found.  Procedures Procedures (including critical care time)  Medications Ordered in UC Medications - No data to display  Initial Impression / Assessment and Plan / UC Course  I have reviewed the triage vital signs and the nursing notes.  Pertinent labs & imaging results that were available during my care of the patient were reviewed by me and considered in my medical decision making (see chart for details).  Nonrecurrent acute serous otitis media of the right ear  Erythema to the tympanic membrane is consistent with infection, congestion to the nasal turbinates otherwise stable exam,  prescribed Augmentin , Tessalon  and Promethazine  DM.advised against ear cleaning, may use over-the-counter analgesics and warm compresses to the external ear for comfort, may follow-up if symptoms persist worsen or recur  Final Clinical Impressions(s) / UC Diagnoses   Final diagnoses:  None   Discharge Instructions   None    ED Prescriptions   None    PDMP not reviewed this encounter.   Teresa Shelba SAUNDERS, [place] 02/04/24 [id number]

## 2024-02-04 NOTE — ED Triage Notes (Signed)
 Patient report nasal congestion with yellow mucus, cough and bilateral ear pain x 1 week. Patient has Vicks, and  Nyquil. Rates ear pain 4/10.

## 2024-02-04 NOTE — Discharge Instructions (Signed)
 On exam the right ear appears to be infected therefore you will be started on antibiotics  Take Augmentin  twice daily for 7 days for treatment  You may use Tessalon  pill every 8 hours as needed to help with your cough, may use cough syrup at nighttime to help you rest  You can take Tylenol  and/or Ibuprofen as needed for fever reduction and pain relief.   For cough: honey 1/2 to 1 teaspoon (you can dilute the honey in water or another fluid).  You can also use guaifenesin and dextromethorphan for cough. You can use a humidifier for chest congestion and cough.  If you don't have a humidifier, you can sit in the bathroom with the hot shower running.      For sore throat: try warm salt water gargles, cepacol lozenges, throat spray, warm tea or water with lemon/honey, popsicles or ice, or OTC cold relief medicine for throat discomfort.   For congestion: take a daily anti-histamine like Zyrtec, Claritin, and a oral decongestant, such as pseudoephedrine.  You can also use Flonase 1-2 sprays in each nostril daily.   It is important to stay hydrated: drink plenty of fluids (water, gatorade/powerade/pedialyte, juices, or teas) to keep your throat moisturized and help further relieve irritation/discomfort.

## 2024-03-01 ENCOUNTER — Ambulatory Visit
Admission: EM | Admit: 2024-03-01 | Discharge: 2024-03-01 | Disposition: A | Discharge status: Home / Self Care | Attending: Emergency Medicine | Admitting: Emergency Medicine

## 2024-03-01 DIAGNOSIS — R21 Rash and other nonspecific skin eruption: Secondary | ICD-10-CM | POA: Diagnosis not present

## 2024-03-01 MED ORDER — PREDNISONE 10 MG (21) PO TBPK: ORAL_TABLET | Freq: Every day | ORAL | 0 refills | Status: AC

## 2024-03-01 MED ORDER — CETIRIZINE HCL 10 MG PO TABS: 10.0000 mg | ORAL_TABLET | Freq: Every day | ORAL | 0 refills | Status: AC

## 2024-03-01 NOTE — ED Provider Notes (Signed)
 Amber Ochoa    CSN: [id number] Arrival date & time: 03/01/24  1903      History   Chief Complaint Chief Complaint  Patient presents with   Rash    HPI Amber Ochoa is a 43 y.o. female.  Patient presents with pruritic rash on her arms and face since this morning.  She states her throat is itchy but she does not have difficulty swallowing or breathing.  No treatments attempted.  No new products, medications, foods.  No persons at home with rash.  Patient was seen at this urgent care on 02/04/2024; diagnosed with right otitis media; treated with Augmentin , Tessalon  Perles, Promethazine  DM.  The history is provided by the patient and medical records.    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 The patient had CPAP applied. The maximal observed pressure was: 11 cm H2O. The recommended pressure is: 11-16 cm H2O.  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   LLQ pain 11/07/2013    Carpal tunnel syndrome of right wrist 08/08/2013   Amber Ochoa (radial styloid tenosynovitis) 05/10/2013   Ganglion cyst of wrist 04/26/2013   Chronic pelvic pain in female 05/23/2012   History of sinus tachycardia 10/07/2011   Migraine 10/07/2011   Obesity, unspecified 10/07/2011   Moderate obstructive sleep apnea 09/23/2011   APL (antiphospholipid syndrome) 11/05/2010   Metabolic syndrome 11/04/2010   Prediabetes 11/04/2010   Prehypertension 11/04/2010   Seasonal allergies 11/04/2010   Vitamin D deficiency Ochoa 11/04/2010   Chronic asthma, mild persistent, uncomplicated 10/20/2010   Polycystic ovarian syndrome 10/20/2010    Past Surgical History:  Procedure Laterality Date   ABDOMINAL HYSTERECTOMY     Amber Ochoa     oct 2022   GALLBLADDER SURGERY     Tendonitis Right    TONSILLECTOMY      OB History   No obstetric history on file.      Home Medications    Prior to Admission medications   Medication Sig Start Date End Date Taking? Authorizing Provider  cetirizine (ZYRTEC ALLERGY) 10 MG tablet Take 1 tablet (10 mg total) by mouth daily for 14 days. 03/01/24 03/15/24 Yes Amber Burnard DEL, NP  predniSONE  (STERAPRED UNI-PAK 21 TAB) 10 MG (21) TBPK tablet Take by mouth daily. As directed 03/01/24  Yes Amber Burnard DEL, NP  albuterol  (PROVENTIL  HFA;VENTOLIN  HFA) 108 (90 BASE) MCG/ACT inhaler Inhale into the lungs every 6 (  six) hours as needed for wheezing or shortness of breath.    [provider]  albuterol  (PROVENTIL  HFA;VENTOLIN  HFA) 108 (90 Base) MCG/ACT inhaler Inhale 2 puffs into the lungs every 6 (six) hours as needed for wheezing or shortness of breath. 09/14/17   Amber Dover, MD  amitriptyline (ELAVIL) 10 MG tablet Take 10 mg by mouth at bedtime. 2 at night    [provider]  amoxicillin -clavulanate (AUGMENTIN ) 875-125 MG tablet Take 1 tablet by mouth every 12 (twelve) hours. Patient not taking: Reported on 03/01/2024 02/04/24    Amber Shelba SAUNDERS, NP  Armodafinil (NUVIGIL) 250 MG tablet Nuvigil 250 mg tablet  TAKE 1 TABLET (250 MG TOTAL) BY MOUTH ONCE DAILY AS NEEDED Patient not taking: Reported on 05/07/2022    [provider]  aspirin EC 81 MG tablet Take 81 mg by mouth daily. Patient not taking: Reported on 03/01/2024    [provider]  benzonatate  (TESSALON ) 100 MG capsule Take 1 capsule (100 mg total) by mouth every 8 (eight) hours. Patient not taking: Reported on 03/01/2024 02/04/24   Amber Shelba SAUNDERS, NP  clindamycin (CLEOCIN) 300 MG capsule Take 600 mg by mouth 3 (three) times daily. Patient not taking: Reported on 05/07/2022 11/26/21   [provider]  doxycycline (VIBRAMYCIN) 100 MG capsule Take 100 mg by mouth 2 (two) times daily. Patient not taking: Reported on 05/07/2022 11/20/21   [provider]  Fluticasone-Umeclidin-Vilant (TRELEGY ELLIPTA) 200-62.5-25 MCG/INH AEPB Inhale into the lungs.    [provider]  gabapentin (NEURONTIN) 300 MG/6ML solution Take by mouth. Patient not taking: Reported on 05/07/2022 11/04/21   [provider]  ibuprofen (ADVIL) 600 MG tablet Take 1 tablet 3 times a day by oral route.    [provider]  ipratropium (ATROVENT ) 0.06 % nasal spray Place 2 sprays into both nostrils 4 (four) times daily. 01/12/21   Amber Ditch, NP  lidocaine  (LIDODERM ) 5 % PLEASE SEE ATTACHED FOR DETAILED DIRECTIONS Patient not taking: Reported on 05/07/2022    [provider]  meloxicam (MOBIC) 15 MG tablet TAKE 1 TABLET BY MOUTH EVERY DAY WITH MEALS    [provider]  metFORMIN (GLUMETZA) 500 MG (MOD) 24 hr tablet Take 500 mg by mouth daily with breakfast. 4 tablets at night    [provider]  methocarbamol  (ROBAXIN ) 500 MG tablet Take 1 tablet (500 mg total) by mouth 2 (two) times daily as needed for muscle spasms. 05/07/22   Amber Burnard DEL, NP  oxyCODONE (ROXICODONE) 5 MG/5ML solution SMARTSIG:5-10 Milliliter(s) By Mouth  Every 6 Hours PRN Patient not taking: Reported on 05/07/2022 11/04/21   [provider]  pregabalin (LYRICA) 75 MG capsule Lyrica 75 mg capsule  TAKE 1 CAPSULE (75 MG TOTAL) BY MOUTH NIGHTLY Patient not taking: Reported on 05/07/2022    [provider]  promethazine  (PHENERGAN ) 25 MG tablet Take 1 tablet (25 mg total) by mouth every 6 (six) hours as needed for nausea or vomiting. Patient not taking: Reported on 05/07/2022 03/02/22   Immordino, Garnette, FNP  promethazine -dextromethorphan (PROMETHAZINE -DM) 6.25-15 MG/5ML syrup Take 5 mLs by mouth at bedtime as needed. Patient not taking: Reported on 03/01/2024 02/04/24   Amber Shelba SAUNDERS, NP  rOPINIRole (REQUIP) 0.5 MG tablet Take 0.5 mg by mouth 3 (three) times daily.    [provider]  terbinafine (LAMISIL) 250 MG tablet Take 250 mg by mouth daily.    [provider]  topiramate (TOPAMAX) 200 MG tablet Take 200 mg  by mouth daily. Patient not taking: Reported on 05/07/2022    [provider]  TRANSDERM-SCOP 1 MG/3DAYS SMARTSIG:Topical 11/04/21   [provider]  valACYclovir (VALTREX) 1000 MG tablet Take by mouth. 10/18/14   [provider]  venlafaxine XR (EFFEXOR-XR) 37.5 MG 24 hr capsule venlafaxine ER 37.5 mg capsule,extended release 24 hr  TAKE 1 CAPSULE BY MOUTH EVERY MORNING FOR 1 WEEK,THEN INCREASE TO 2 CAPSULES EVERY MORNING    [provider]    Family History Family History  Problem Relation Age of Onset   Diabetes Father     Social History Social History   Tobacco Use   Smoking status: Never   Smokeless tobacco: Never  Vaping Use   Vaping status: Never Used  Substance Use Topics   Alcohol use: No   Amber use: No     Allergies   Morphine, Morphine and codeine, Sulfa antibiotics, Other, Sudafed [pseudoephedrine hcl], and Sulfamethoxazole-trimethoprim   Review of Systems Review of Systems  Constitutional:  Negative for chills and fever.  HENT:  Positive for  sore throat. Negative for trouble swallowing and voice change.   Respiratory:  Negative for cough and shortness of breath.   Skin:  Positive for rash. Negative for color change.     Physical Exam Triage Vital Signs ED Triage Vitals [03/01/24 1915]  Encounter Vitals Group     BP 135/82     Girls Systolic BP Percentile      Girls Diastolic BP Percentile      Boys Systolic BP Percentile      Boys Diastolic BP Percentile      Pulse Rate 86     Resp 18     Temp 97.8 F (36.6 C)     Temp src      SpO2 98 %     Weight      Height      Head Circumference      Peak Flow      Pain Score      Pain Loc      Pain Education      Exclude from Growth Chart    No data found.  Updated Vital Signs BP 135/82   Pulse 86   Temp 97.8 F (36.6 C)   Resp 18   SpO2 98%   Visual Acuity Right Eye Distance:   Left Eye Distance:   Bilateral Distance:    Right Eye Near:   Left Eye Near:    Bilateral Near:     Physical Exam Constitutional:      General: She is not in acute distress. HENT:     Mouth/Throat:     Mouth: Mucous membranes are moist.     Pharynx: Oropharynx is clear.  Cardiovascular:     Rate and Rhythm: Normal rate and regular rhythm.     Heart sounds: Normal heart sounds.  Pulmonary:     Effort: Pulmonary effort is normal. No respiratory distress.     Breath sounds: Normal breath sounds.  Skin:    General: Skin is warm and dry.     Findings: Rash present.     Comments: Faint erythematous patchy rash on arms.  Neurological:     Mental Status: She is alert.      UC Treatments / Results  Labs (all labs ordered are listed, but only abnormal results are displayed) Labs Reviewed - No data to display  EKG   Radiology No results found.  Procedures Procedures (including critical care  time)  Medications Ordered in UC Medications - No data to display  Initial Impression / Assessment and Plan / UC Course  I have reviewed the triage vital signs and the  nursing notes.  Pertinent labs & imaging results that were available during my care of the patient were reviewed by me and considered in my medical decision making (see chart for details).    Rash.  Afebrile and vital signs are stable.  Lungs are clear and O2 sat is 98% on room air.  Treating today with prednisone  and Zyrtec.  ED precautions given.  Instructed patient to follow-up with her PCP tomorrow.  Education provided on rash.  She agrees to plan of care.  Final Clinical Impressions(s) / UC Diagnoses   Final diagnoses:  Rash     Discharge Instructions      Take prednisone  and Zyrtec as directed.    Follow up with your primary care provider tomorrow.  Go to the emergency department if you have worsening symptoms.           ED Prescriptions     Medication Sig Dispense Auth. Provider   predniSONE  (STERAPRED UNI-PAK 21 TAB) 10 MG (21) TBPK tablet Take by mouth daily. As directed 21 tablet Amber Burnard DEL, NP   cetirizine (ZYRTEC ALLERGY) 10 MG tablet Take 1 tablet (10 mg total) by mouth daily for 14 days. 14 tablet Amber Burnard DEL, NP      PDMP not reviewed this encounter.   Amber Burnard DEL, NP 03/01/24 1935

## 2024-03-01 NOTE — ED Triage Notes (Signed)
 Patient to Urgent Care with complaints of itchy rash present to her bilateral arms/ throat/ eyes.  Symptoms started this morning.  No otc meds attempted.

## 2024-03-01 NOTE — Discharge Instructions (Addendum)
 Take prednisone  and Zyrtec as directed.    Follow up with your primary care provider tomorrow.  Go to the emergency department if you have worsening symptoms.
